# Patient Record
Sex: Male | Born: 1965 | Race: White | Hispanic: No | Marital: Married | Smoking: Never smoker
Health system: Southern US, Community
[De-identification: ages and names within clinical notes are randomized; demographics above are authoritative.]

## PROBLEM LIST (undated history)

## (undated) DIAGNOSIS — K219 Gastro-esophageal reflux disease without esophagitis: Secondary | ICD-10-CM

## (undated) DIAGNOSIS — F419 Anxiety disorder, unspecified: Secondary | ICD-10-CM

## (undated) DIAGNOSIS — K859 Acute pancreatitis without necrosis or infection, unspecified: Secondary | ICD-10-CM

## (undated) HISTORY — PX: BACK SURGERY: SHX140

## (undated) HISTORY — PX: APPENDECTOMY: SHX54

---

## 1998-06-25 ENCOUNTER — Emergency Department (HOSPITAL_COMMUNITY): Admission: EM | Admit: 1998-06-25 | Discharge: 1998-06-25 | Payer: Self-pay | Admitting: Emergency Medicine

## 2000-10-14 ENCOUNTER — Ambulatory Visit (HOSPITAL_COMMUNITY): Admission: RE | Admit: 2000-10-14 | Discharge: 2000-10-14 | Payer: Self-pay | Admitting: Orthopaedic Surgery

## 2000-10-28 ENCOUNTER — Ambulatory Visit (HOSPITAL_COMMUNITY): Admission: RE | Admit: 2000-10-28 | Discharge: 2000-10-28 | Payer: Self-pay | Admitting: Orthopaedic Surgery

## 2000-11-12 ENCOUNTER — Ambulatory Visit (HOSPITAL_COMMUNITY): Admission: RE | Admit: 2000-11-12 | Discharge: 2000-11-12 | Payer: Self-pay | Admitting: Orthopaedic Surgery

## 2001-05-07 ENCOUNTER — Encounter: Payer: Self-pay | Admitting: Neurosurgery

## 2001-05-07 ENCOUNTER — Ambulatory Visit (HOSPITAL_COMMUNITY): Admission: RE | Admit: 2001-05-07 | Discharge: 2001-05-07 | Payer: Self-pay | Admitting: Neurosurgery

## 2001-11-10 ENCOUNTER — Encounter: Admission: RE | Admit: 2001-11-10 | Discharge: 2001-11-10 | Payer: Self-pay | Admitting: Neurosurgery

## 2001-11-10 ENCOUNTER — Encounter: Payer: Self-pay | Admitting: Neurosurgery

## 2001-11-24 ENCOUNTER — Encounter: Payer: Self-pay | Admitting: Neurosurgery

## 2001-11-24 ENCOUNTER — Encounter: Admission: RE | Admit: 2001-11-24 | Discharge: 2001-11-24 | Payer: Self-pay | Admitting: Neurosurgery

## 2001-12-08 ENCOUNTER — Encounter: Payer: Self-pay | Admitting: Neurosurgery

## 2001-12-08 ENCOUNTER — Encounter: Admission: RE | Admit: 2001-12-08 | Discharge: 2001-12-08 | Payer: Self-pay | Admitting: Neurosurgery

## 2004-12-13 ENCOUNTER — Ambulatory Visit (HOSPITAL_COMMUNITY): Admission: RE | Admit: 2004-12-13 | Discharge: 2004-12-13 | Payer: Self-pay | Admitting: Neurosurgery

## 2005-02-10 ENCOUNTER — Ambulatory Visit (HOSPITAL_COMMUNITY): Admission: RE | Admit: 2005-02-10 | Discharge: 2005-02-10 | Payer: Self-pay | Admitting: General Surgery

## 2006-02-04 ENCOUNTER — Encounter: Payer: Self-pay | Admitting: Emergency Medicine

## 2013-10-11 ENCOUNTER — Encounter (HOSPITAL_COMMUNITY): Payer: Self-pay | Admitting: Pharmacy Technician

## 2013-10-19 ENCOUNTER — Encounter (HOSPITAL_COMMUNITY): Payer: Self-pay | Admitting: *Deleted

## 2013-10-31 ENCOUNTER — Other Ambulatory Visit: Payer: Self-pay | Admitting: Gastroenterology

## 2013-10-31 NOTE — Addendum Note (Signed)
Addended by: Esmeralda Malay on: 10/31/2013 01:35 PM   Modules accepted: Orders  

## 2013-11-02 ENCOUNTER — Encounter (HOSPITAL_COMMUNITY): Payer: BC Managed Care – PPO | Admitting: Certified Registered"

## 2013-11-02 ENCOUNTER — Encounter (HOSPITAL_COMMUNITY): Payer: Self-pay | Admitting: *Deleted

## 2013-11-02 ENCOUNTER — Ambulatory Visit (HOSPITAL_COMMUNITY): Payer: BC Managed Care – PPO | Admitting: Certified Registered"

## 2013-11-02 ENCOUNTER — Ambulatory Visit (HOSPITAL_COMMUNITY)
Admission: RE | Admit: 2013-11-02 | Discharge: 2013-11-02 | Disposition: A | Discharge status: Home / Self Care | Payer: BC Managed Care – PPO | Source: Ambulatory Visit | Attending: Gastroenterology | Admitting: Gastroenterology

## 2013-11-02 ENCOUNTER — Encounter (HOSPITAL_COMMUNITY)
Admission: RE | Disposition: A | Discharge status: Home / Self Care | Payer: Self-pay | Source: Ambulatory Visit | Attending: Gastroenterology

## 2013-11-02 DIAGNOSIS — K209 Esophagitis, unspecified without bleeding: Secondary | ICD-10-CM | POA: Insufficient documentation

## 2013-11-02 DIAGNOSIS — K449 Diaphragmatic hernia without obstruction or gangrene: Secondary | ICD-10-CM | POA: Insufficient documentation

## 2013-11-02 DIAGNOSIS — K222 Esophageal obstruction: Secondary | ICD-10-CM | POA: Insufficient documentation

## 2013-11-02 DIAGNOSIS — K219 Gastro-esophageal reflux disease without esophagitis: Secondary | ICD-10-CM | POA: Insufficient documentation

## 2013-11-02 DIAGNOSIS — R109 Unspecified abdominal pain: Secondary | ICD-10-CM | POA: Insufficient documentation

## 2013-11-02 DIAGNOSIS — K859 Acute pancreatitis without necrosis or infection, unspecified: Secondary | ICD-10-CM | POA: Insufficient documentation

## 2013-11-02 HISTORY — PX: EUS: SHX5427

## 2013-11-02 HISTORY — DX: Acute pancreatitis without necrosis or infection, unspecified: K85.90

## 2013-11-02 HISTORY — DX: Gastro-esophageal reflux disease without esophagitis: K21.9

## 2013-11-02 HISTORY — DX: Anxiety disorder, unspecified: F41.9

## 2013-11-02 HISTORY — PX: ESOPHAGOGASTRODUODENOSCOPY (EGD) WITH PROPOFOL: SHX5813

## 2013-11-02 SURGERY — ESOPHAGOGASTRODUODENOSCOPY (EGD) WITH PROPOFOL
Anesthesia: Monitor Anesthesia Care

## 2013-11-02 MED ORDER — ONDANSETRON HCL 4 MG/2ML IJ SOLN
INTRAMUSCULAR | Status: DC | PRN
  Administered 2013-11-02: 4 mg via INTRAVENOUS

## 2013-11-02 MED ORDER — LACTATED RINGERS IV SOLN
INTRAVENOUS | Status: DC
  Administered 2013-11-02: 1000 mL via INTRAVENOUS

## 2013-11-02 MED ORDER — PROPOFOL 10 MG/ML IV BOLUS
INTRAVENOUS | Status: DC | PRN
  Administered 2013-11-02: 50 mg via INTRAVENOUS

## 2013-11-02 MED ORDER — PROPOFOL 10 MG/ML IV BOLUS
INTRAVENOUS | Status: AC
  Filled 2013-11-02: qty 20

## 2013-11-02 MED ORDER — PROPOFOL INFUSION 10 MG/ML OPTIME
INTRAVENOUS | Status: DC | PRN
  Administered 2013-11-02: 200 ug/kg/min via INTRAVENOUS

## 2013-11-02 MED ORDER — BUTAMBEN-TETRACAINE-BENZOCAINE 2-2-14 % EX AERO
INHALATION_SPRAY | CUTANEOUS | Status: DC | PRN
  Administered 2013-11-02: 2 via TOPICAL

## 2013-11-02 MED ORDER — ONDANSETRON HCL 4 MG/2ML IJ SOLN
INTRAMUSCULAR | Status: AC
  Filled 2013-11-02: qty 2

## 2013-11-02 MED ORDER — SODIUM CHLORIDE 0.9 % IV SOLN: INTRAVENOUS | Status: DC

## 2013-11-02 SURGICAL SUPPLY — 15 items

## 2013-11-02 NOTE — Discharge Instructions (Signed)
Endoscopy °Care After °Please read the instructions outlined below and refer to this sheet in the next few weeks. These discharge instructions provide you with general information on caring for yourself after you leave the hospital. Your doctor may also give you specific instructions. While your treatment has been planned according to the most current medical practices available, unavoidable complications occasionally occur. If you have any problems or questions after discharge, please call Dr. Ares Cardozo ([facility]) at [phone]. ° °HOME CARE INSTRUCTIONS °Activity °· You may resume your regular activity but move at a slower pace for the next 24 hours.  °· Take frequent rest periods for the next 24 hours.  °· Walking will help expel (get rid of) the air and reduce the bloated feeling in your abdomen.  °· No driving for 24 hours (because of the anesthesia (medicine) used during the test).  °· You may shower.  °· Do not sign any important legal documents or operate any machinery for 24 hours (because of the anesthesia used during the test).  °Nutrition °· Drink plenty of fluids.  °· You may resume your normal diet.  °· Begin with a light meal and progress to your normal diet.  °· Avoid alcoholic beverages for 24 hours or as instructed by your caregiver.  °Medications °You may resume your normal medications unless your caregiver tells you otherwise. °What you can expect today °· You may experience abdominal discomfort such as a feeling of fullness or "gas" pains.  °· You may experience a sore throat for 2 to 3 days. This is normal. Gargling with salt water may help this.  °·  °SEEK IMMEDIATE MEDICAL CARE IF: °· You have excessive nausea (feeling sick to your stomach) and/or vomiting.  °· You have severe abdominal pain and distention (swelling).  °· You have trouble swallowing.  °· You have a temperature over 100° F (37.8° C).  °· You have rectal bleeding or vomiting of blood.  °Document Released:  05/06/2004 Document Revised: 06/04/2011 Document Reviewed: 11/17/2007 °ExitCare® Patient Information ©2012 [employer]. °

## 2013-11-02 NOTE — Progress Notes (Signed)
Prior to procedure, patient requested that a picture be made of himself  using his Iphone while he was being scoped.  Picture was taken.

## 2013-11-02 NOTE — Preoperative (Signed)
Beta Blockers   Reason not to administer Beta Blockers:Not Applicable 

## 2013-11-02 NOTE — Transfer of Care (Signed)
Immediate Anesthesia Transfer of Care Note  Patient: Tyler Cantrell  Procedure(s) Performed: Procedure(s) (LRB): ESOPHAGOGASTRODUODENOSCOPY (EGD) WITH PROPOFOL (N/A) ESOPHAGEAL ENDOSCOPIC ULTRASOUND (EUS) RADIAL (N/A)  Patient Location: PACU  Anesthesia Type: MAC  Level of Consciousness: sedated, patient cooperative and responds to stimulation  Airway & Oxygen Therapy: Patient Spontanous Breathing and Patient connected to face mask oxgen  Post-op Assessment: Report given to PACU RN and Post -op Vital signs reviewed and stable  Post vital signs: Reviewed and stable  Complications: No apparent anesthesia complications

## 2013-11-02 NOTE — Anesthesia Postprocedure Evaluation (Signed)
Anesthesia Post Note  Patient: Tyler BlazerJason B Cantrell  Procedure(s) Performed: Procedure(s) (LRB): ESOPHAGOGASTRODUODENOSCOPY (EGD) WITH PROPOFOL (N/A) ESOPHAGEAL ENDOSCOPIC ULTRASOUND (EUS) RADIAL (N/A)  Anesthesia type: MAC  Patient location: PACU  Post pain: Pain level controlled  Post assessment: Post-op Vital signs reviewed  Last Vitals: BP 108/67  Temp(Src) 36.6 C (Oral)  Resp 20  Ht 6\' 1"  (1.854 m)  Wt 200 lb (90.719 kg)  BMI 26.39 kg/m2  SpO2 94%  Post vital signs: Reviewed  Level of consciousness: awake  Complications: No apparent anesthesia complications

## 2013-11-02 NOTE — Op Note (Signed)
[facility] [address] [place], [id number]   ENDOSCOPIC ULTRASOUND PROCEDURE REPORT  PATIENT: Tyler Cantrell, Tyler B.  MR#: [id number] BIRTHDATE: 1966-07-01  GENDER: Male ENDOSCOPIST: Willis ModenaWilliam Bernetta Sutley, MD REFERRED BY:  Guerry Bruinichard Tisovec, M.D. PROCEDURE DATE:  11/02/2013 PROCEDURE:   Esophagogastroduodenoscopy with biopsies; upper endoscopic ultrasound ASA CLASS:      Class II INDICATIONS:   1.  abdominal pain, history of pancreatitis. MEDICATIONS: MAC sedation, administered by CRNA  DESCRIPTION OF PROCEDURE:   After the risks benefits and alternatives of the procedure were  explained, informed consent was obtained. The patient was then placed in the left, lateral, decubitus postion and IV sedation was administered. Throughout the procedure, the patients blood pressure, pulse and oxygen saturations were monitored continuously.  Under direct visualization, the forward-viewing  endoscope followed by the radial echoendoscope were sequentially introduced through the mouth and advanced to the second portion of the duodenum .  Water was used as necessary to provide an acoustic interface.  Upon completion of the imaging, water was removed and the patient was sent to the recovery room in satisfactory condition.    FINDINGS:      EGD:  Medium-sized hiatal hernia.  Widely patent Schatzki's ring.  Mild distal esophagitis. Small segment of salmon-colored mucosa extending 1cm proximal to GE junction, biopsies taken with cold forceps to evaluate for Barrett's esophagus.  Otherwise normal endoscopy to the second portion of the duodenum. EUS:  Normal-appearing pancreas without evidence of chronic pancreatitis.  No pancreatic mass seen.  Gallbladder normal without stones or sludge.  Non-dilated bile duct without wall thickening or choledocholithiasis.  IMPRESSION:     As above.  Some of patient's post-prandial pain symptoms might be GERD-related.  Pancreatitis most  likely alcohol-related, gallbladder less likely.  RECOMMENDATIONS:     1.  Watch for potential complications of procedure. 2.  Await biopsy results. 3.  Prilosec OTC 20 mg once-a-day for 6 weeks. 4.  Conservative antireflux measures. 5.  Follow-up in office in 6 weeks.   _______________________________ Rosalie DoctoreSignedWillis Modena:  Srihari Shellhammer, MD 11/02/2013 10:07 AM   CC:

## 2013-11-02 NOTE — H&P (Signed)
Patient interval history reviewed.  Patient examined again.  There has been no change from documented H/P dated 10/04/2013 (scanned into chart from our office) except as documented above.  Assessment:  1.  Abdominal pain. 2.  Pancreatitis.  Plan:  1.  Endoscopy and endoscopic ultrasound. 2.  Risks (bleeding, infection, bowel perforation that could require surgery, sedation-related changes in cardiopulmonary systems), benefits (identification and possible treatment of source of symptoms, exclusion of certain causes of symptoms), and alternatives (watchful waiting, radiographic imaging studies, empiric medical treatment) of upper endoscopy and endoscopic ultrasound (EGD + EUS) were explained to patient/family in detail and patient wishes to proceed.

## 2013-11-02 NOTE — Anesthesia Preprocedure Evaluation (Signed)
Anesthesia Evaluation  Patient identified by MRN, date of birth, ID band Patient awake    Reviewed: Allergy & Precautions, H&P , NPO status , Patient's Chart, lab work & pertinent test results  Airway Mallampati: II TM Distance: >3 FB Neck ROM: Full    Dental  (+) Dental Advisory Given   Pulmonary neg pulmonary ROS,  breath sounds clear to auscultation        Cardiovascular negative cardio ROS  Rhythm:Regular Rate:Normal     Neuro/Psych PSYCHIATRIC DISORDERS Anxiety negative neurological ROS     GI/Hepatic Neg liver ROS, GERD-  ,  Endo/Other  negative endocrine ROS  Renal/GU negative Renal ROS     Musculoskeletal negative musculoskeletal ROS (+)   Abdominal   Peds negative pediatric ROS (+)  Hematology negative hematology ROS (+)   Anesthesia Other Findings   Reproductive/Obstetrics                           Anesthesia Physical Anesthesia Plan  ASA: II  Anesthesia Plan: MAC   Post-op Pain Management:    Induction: Intravenous  Airway Management Planned:   Additional Equipment:   Intra-op Plan:   Post-operative Plan:   Informed Consent: I have reviewed the patients History and Physical, chart, labs and discussed the procedure including the risks, benefits and alternatives for the proposed anesthesia with the patient or authorized representative who has indicated his/her understanding and acceptance.   Dental advisory given  Plan Discussed with: CRNA  Anesthesia Plan Comments:         Anesthesia Quick Evaluation

## 2013-11-03 ENCOUNTER — Encounter (HOSPITAL_COMMUNITY): Payer: Self-pay | Admitting: Gastroenterology

## 2014-04-11 ENCOUNTER — Other Ambulatory Visit (HOSPITAL_COMMUNITY): Payer: Self-pay | Admitting: Gastroenterology

## 2014-04-11 DIAGNOSIS — R1011 Right upper quadrant pain: Secondary | ICD-10-CM

## 2014-04-18 ENCOUNTER — Encounter (HOSPITAL_COMMUNITY)
Admission: RE | Admit: 2014-04-18 | Discharge: 2014-04-18 | Disposition: A | Discharge status: Home / Self Care | Payer: BC Managed Care – PPO | Source: Ambulatory Visit | Attending: Gastroenterology | Admitting: Gastroenterology

## 2014-04-18 DIAGNOSIS — R1011 Right upper quadrant pain: Secondary | ICD-10-CM | POA: Insufficient documentation

## 2014-04-18 MED ORDER — SINCALIDE 5 MCG IJ SOLR
0.0200 ug/kg | Freq: Once | INTRAMUSCULAR | Status: AC
  Administered 2014-04-18: 11:00:00 via INTRAVENOUS

## 2014-04-18 MED ORDER — SINCALIDE 5 MCG IJ SOLR
INTRAMUSCULAR | Status: AC
  Filled 2014-04-18: qty 5

## 2014-04-18 MED ORDER — STERILE WATER FOR INJECTION IJ SOLN
5.0000 mL | Freq: Once | INTRAMUSCULAR | Status: AC
  Administered 2014-04-18: 5 mL via INTRAMUSCULAR
  Filled 2014-04-18: qty 5

## 2014-04-18 MED ORDER — STERILE WATER FOR INJECTION IJ SOLN
INTRAMUSCULAR | Status: AC
  Administered 2014-04-18: 5 mL via INTRAMUSCULAR
  Filled 2014-04-18: qty 10

## 2014-04-18 MED ORDER — TECHNETIUM TC 99M MEBROFENIN IV KIT
5.0000 | PACK | Freq: Once | INTRAVENOUS | Status: AC | PRN
  Administered 2014-04-18: 5 via INTRAVENOUS

## 2014-04-25 ENCOUNTER — Ambulatory Visit (HOSPITAL_COMMUNITY): Payer: BC Managed Care – PPO

## 2014-05-01 ENCOUNTER — Encounter (INDEPENDENT_AMBULATORY_CARE_PROVIDER_SITE_OTHER): Payer: Self-pay | Admitting: Surgery

## 2014-05-01 ENCOUNTER — Other Ambulatory Visit (INDEPENDENT_AMBULATORY_CARE_PROVIDER_SITE_OTHER): Payer: Self-pay | Admitting: Surgery

## 2014-05-11 ENCOUNTER — Ambulatory Visit (INDEPENDENT_AMBULATORY_CARE_PROVIDER_SITE_OTHER): Payer: BC Managed Care – PPO | Admitting: Surgery

## 2014-05-11 ENCOUNTER — Encounter (INDEPENDENT_AMBULATORY_CARE_PROVIDER_SITE_OTHER): Payer: Self-pay | Admitting: Surgery

## 2014-05-11 VITALS — BP 118/80 | HR 57 | Temp 97.6°F | Ht 73.0 in | Wt 201.0 lb

## 2014-05-11 DIAGNOSIS — R1011 Right upper quadrant pain: Secondary | ICD-10-CM | POA: Insufficient documentation

## 2014-05-11 DIAGNOSIS — Z8719 Personal history of other diseases of the digestive system: Secondary | ICD-10-CM

## 2014-05-11 NOTE — Progress Notes (Signed)
Patient ID: Tyler Cantrell, male   DOB: 1965/12/29, 48 y.o.   MRN: [id number]  Chief Complaint  Patient presents with  . Abdominal Pain    HPI Tyler Cantrell is a 48 y.o. male.   HPI This is a pleasant 48 year old gentleman referred to me by Dr. Dulce Sellar for abdominal pain. He has been having epigastric and right upper quadrant abdominal pain intermittently for almost a year. He has had bloating with nausea. He even had a bout of mild pancreatitis. This occurs after a missed anything he eats. Some attacks will last as long as 4 days. He has had light colored bowel movements. His workup has included an upper endoscopy and endoscopic ultrasound which was negative for gallstones. He denies jaundice. The pain is described as both sharp and cramping. Past Medical History  Diagnosis Date  . Anxiety   . GERD (gastroesophageal reflux disease)   . Pancreatitis off and off last 3-4 months    Past Surgical History  Procedure Laterality Date  . Appendectomy    . Back surgery    . Esophagogastroduodenoscopy (egd) with propofol N/A 11/02/2013    Procedure: ESOPHAGOGASTRODUODENOSCOPY (EGD) WITH PROPOFOL;  Surgeon: Willis Modena, MD;  Location: [facility];  Service: Endoscopy;  Laterality: N/A;  . Eus N/A 11/02/2013    Procedure: ESOPHAGEAL ENDOSCOPIC ULTRASOUND (EUS) RADIAL;  Surgeon: Willis Modena, MD;  Location: [facility];  Service: Endoscopy;  Laterality: N/A;    History reviewed. No pertinent family history.  Social History History  Substance Use Topics  . Smoking status: Never Smoker   . Smokeless tobacco: Never Used  . Alcohol Use: No    Allergies  Allergen Reactions  . Penicillins Hives    Current Outpatient Prescriptions  Medication Sig Dispense Refill  . beta carotene w/minerals (OCUVITE) tablet Take 1 tablet by mouth daily.      Marland Kitchen esomeprazole (NEXIUM) 20 MG capsule Take 20 mg by mouth daily at 12 noon.      Marland Kitchen glucosamine-chondroitin 500-400 MG tablet Take 1 tablet by  mouth 3 (three) times daily.      Marland Kitchen HYDROcodone-acetaminophen (NORCO/VICODIN) 5-325 MG per tablet Take 1 tablet by mouth every 6 (six) hours as needed for moderate pain.      . Omega-3 Fatty Acids (FISH OIL) 1000 MG CAPS Take by mouth.      . Pancrelipase, Lip-Prot-Amyl, 24000 UNITS CPEP Take 24,000 Units by mouth daily.      . sertraline (ZOLOFT) 50 MG tablet Take 50 mg by mouth daily with breakfast.      . Testosterone (ANDROGEL) 20.25 MG/1.25GM (1.62%) GEL Place 1 application onto the skin daily.       No current facility-administered medications for this visit.    Review of Systems Review of Systems  Constitutional: Negative for fever, chills and unexpected weight change.  HENT: Negative for congestion, hearing loss, sore throat, trouble swallowing and voice change.   Eyes: Negative for visual disturbance.  Respiratory: Negative for cough and wheezing.   Cardiovascular: Negative for chest pain, palpitations and leg swelling.  Gastrointestinal: Positive for nausea, abdominal pain and abdominal distention. Negative for vomiting, diarrhea, constipation, blood in stool, anal bleeding and rectal pain.  Genitourinary: Negative for hematuria and difficulty urinating.  Musculoskeletal: Negative for arthralgias.  Skin: Negative for rash and wound.  Neurological: Negative for seizures, syncope, weakness and headaches.  Hematological: Negative for adenopathy. Does not bruise/bleed easily.  Psychiatric/Behavioral: Negative for confusion.    Blood pressure 118/80, pulse 57, temperature 97.6  F (36.4 C), height 6\' 1"  (1.854 m), weight 201 lb (91.173 kg).  Physical Exam Physical Exam  Constitutional: He is oriented to person, place, and time. He appears well-developed and well-nourished. No distress.  HENT:  Head: Normocephalic and atraumatic.  Right Ear: External ear normal.  Left Ear: External ear normal.  Nose: Nose normal.  Mouth/Throat: Oropharynx is clear and moist. No oropharyngeal  exudate.  Eyes: Conjunctivae are normal. Pupils are equal, round, and reactive to light. Right eye exhibits no discharge. Left eye exhibits no discharge. No scleral icterus.  Neck: Normal range of motion. Neck supple. No tracheal deviation present.  Cardiovascular: Normal rate, regular rhythm, normal heart sounds and intact distal pulses.   No murmur heard. Pulmonary/Chest: Effort normal and breath sounds normal. No respiratory distress. He has no wheezes.  Abdominal: Soft. Bowel sounds are normal. There is tenderness. There is guarding.  There is tenderness with guarding in the right upper quadrant  Musculoskeletal: Normal range of motion. He exhibits no edema and no tenderness.  Lymphadenopathy:    He has no cervical adenopathy.  Neurological: He is alert and oriented to person, place, and time.  Skin: Skin is warm and dry. No rash noted. No erythema.  Psychiatric: His behavior is normal. Judgment normal.    Data Reviewed I have reviewed his laboratory data and notes  Assessment    Right upper quadrant abdominal pain of uncertain etiology     Plan    Given his symptoms and his family history of malignancy, I recommend a CAT scan of the abdomen and pelvis to evaluate both his liver, gallbladder and pancreas. I do suspect this may represent chronic cholecystitis. Prior to a laparoscopic cholecystectomy, I believe the scan is necessary to rule out another cause of his symptoms preoperatively. I will call him back with the results of the CAT scan        Christmas Faraci A 05/11/2014, 3:44 PM

## 2014-05-15 ENCOUNTER — Other Ambulatory Visit: Payer: BC Managed Care – PPO

## 2014-05-16 ENCOUNTER — Other Ambulatory Visit: Payer: BC Managed Care – PPO

## 2014-05-18 ENCOUNTER — Ambulatory Visit
Admission: RE | Admit: 2014-05-18 | Discharge: 2014-05-18 | Disposition: A | Discharge status: Home / Self Care | Payer: BC Managed Care – PPO | Source: Ambulatory Visit | Attending: Surgery | Admitting: Surgery

## 2014-05-18 DIAGNOSIS — R1011 Right upper quadrant pain: Secondary | ICD-10-CM

## 2014-05-18 DIAGNOSIS — Z8719 Personal history of other diseases of the digestive system: Secondary | ICD-10-CM

## 2014-05-18 MED ORDER — IOHEXOL 300 MG/ML  SOLN
125.0000 mL | Freq: Once | INTRAMUSCULAR | Status: AC | PRN
  Administered 2014-05-18: 125 mL via INTRAVENOUS

## 2014-05-21 ENCOUNTER — Other Ambulatory Visit (INDEPENDENT_AMBULATORY_CARE_PROVIDER_SITE_OTHER): Payer: Self-pay | Admitting: Surgery

## 2014-05-22 ENCOUNTER — Encounter (INDEPENDENT_AMBULATORY_CARE_PROVIDER_SITE_OTHER): Payer: Self-pay

## 2014-08-03 ENCOUNTER — Other Ambulatory Visit (INDEPENDENT_AMBULATORY_CARE_PROVIDER_SITE_OTHER): Payer: Self-pay | Admitting: Surgery

## 2014-09-27 ENCOUNTER — Other Ambulatory Visit (INDEPENDENT_AMBULATORY_CARE_PROVIDER_SITE_OTHER): Payer: Self-pay | Admitting: Surgery

## 2015-01-17 ENCOUNTER — Other Ambulatory Visit (HOSPITAL_COMMUNITY): Payer: Self-pay | Admitting: Gastroenterology

## 2015-01-17 DIAGNOSIS — R140 Abdominal distension (gaseous): Secondary | ICD-10-CM

## 2015-01-17 DIAGNOSIS — R1013 Epigastric pain: Secondary | ICD-10-CM

## 2015-01-19 IMAGING — CT CT ABD-PELV W/ CM
2 of 5 series · 17 of 46 positions shown, 19 images · IV contrast (READICAT/WATER & [ID] OMNI 300)
Comparison: None.

CLINICAL DATA: Right upper quadrant abdominal pain, bloating,
nausea and history of pancreatitis. Prior appendectomy.

EXAM:
CT ABDOMEN AND PELVIS WITH CONTRAST
TECHNIQUE: Multidetector CT imaging of the abdomen and pelvis was performed
using the standard protocol following bolus administration of
intravenous contrast.
CONTRAST:  125mL OMNIPAQUE IOHEXOL 300 MG/ML  SOLN

[Series 2: abd/pelvis with · axial · 0.76mm/px · z∈[-426,-26]mm · 14 of 91 slices shown, 16 images]
[im 6/91  soft-tissue]
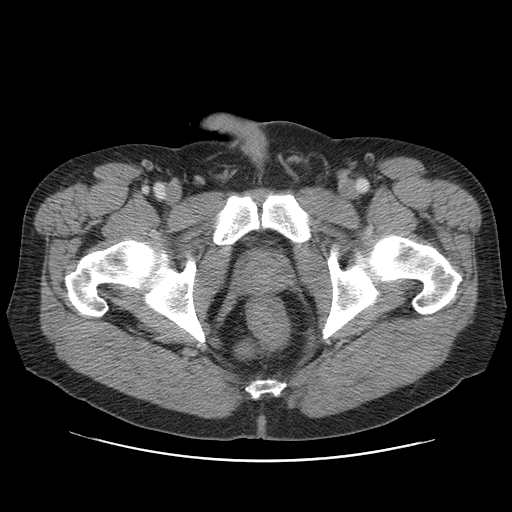
[im 6/91  bone]
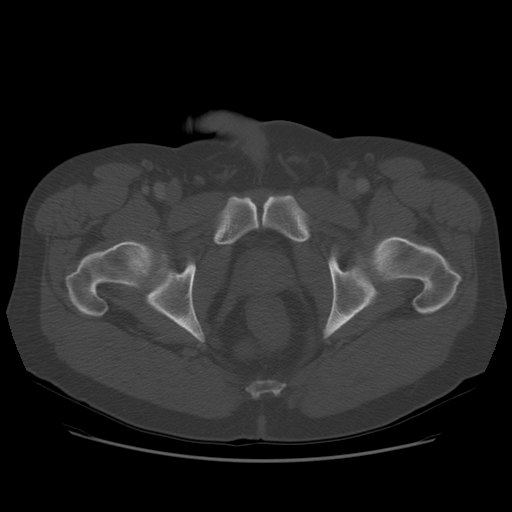
[im 11/91  soft-tissue]
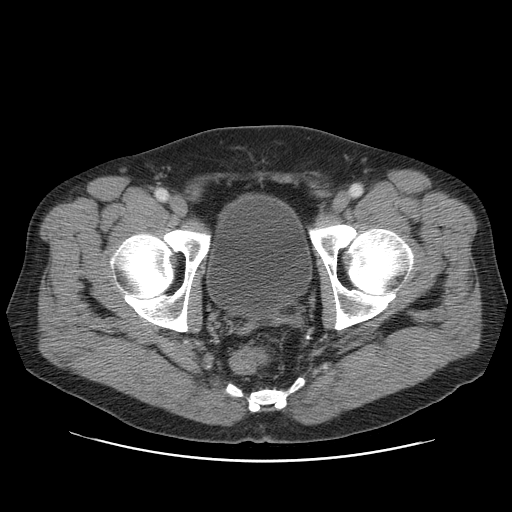
[im 21/91  soft-tissue]
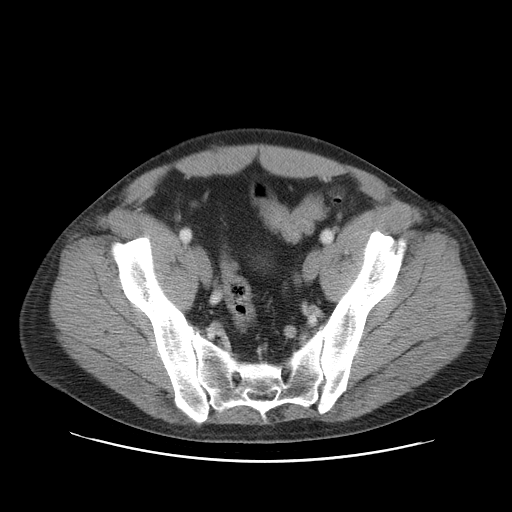
[im 26/91  soft-tissue]
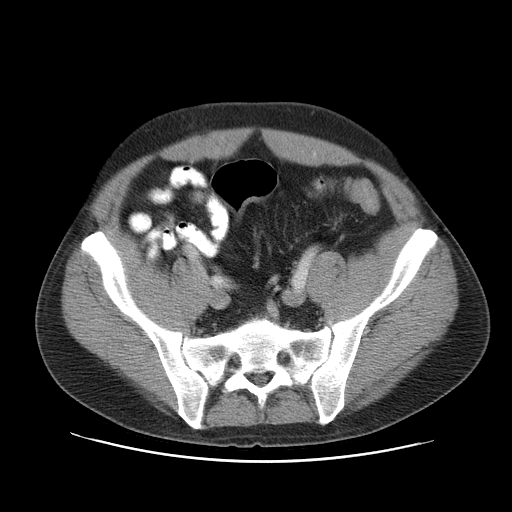
[im 31/91  soft-tissue]
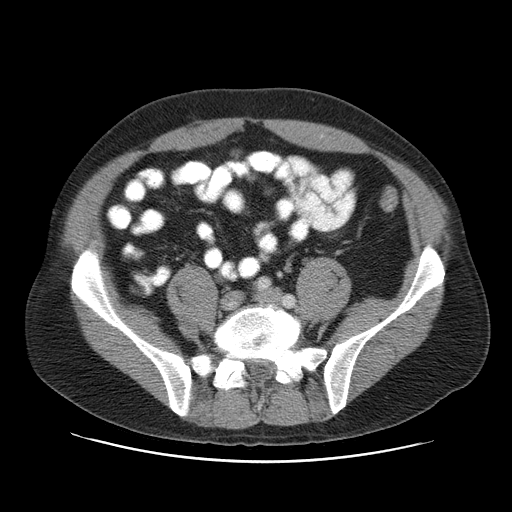
[im 36/91  soft-tissue]
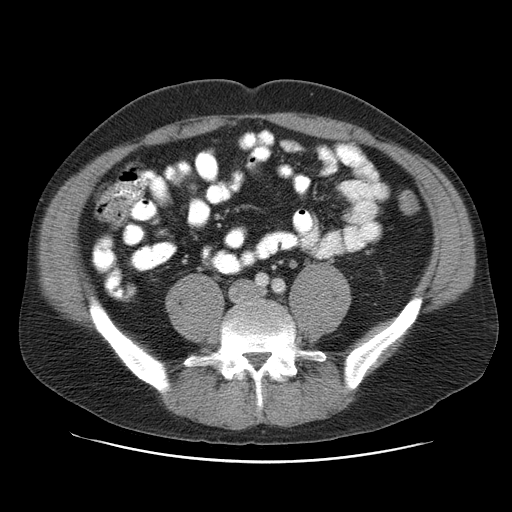
[im 41/91  soft-tissue]
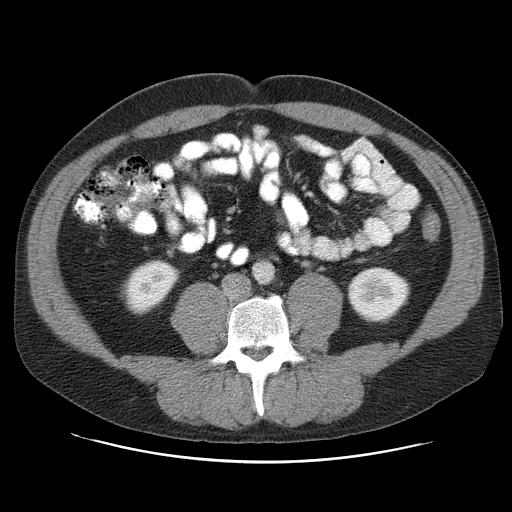
[im 51/91  soft-tissue]
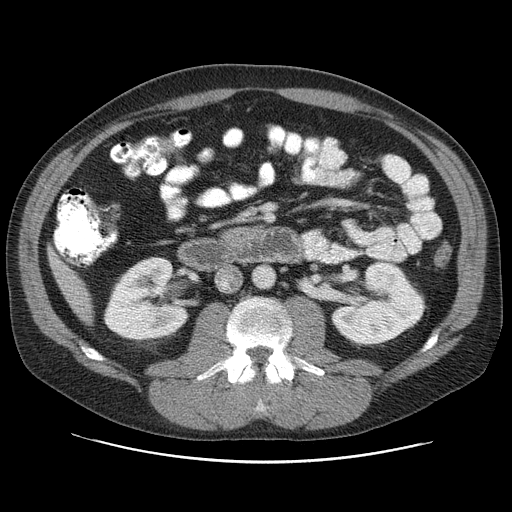
[im 56/91  soft-tissue]
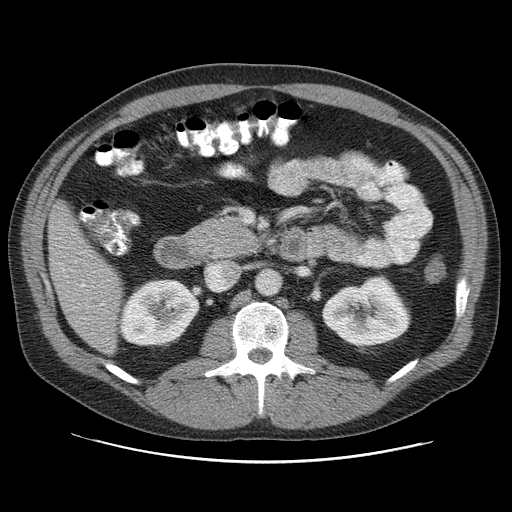
[im 56/91  bone]
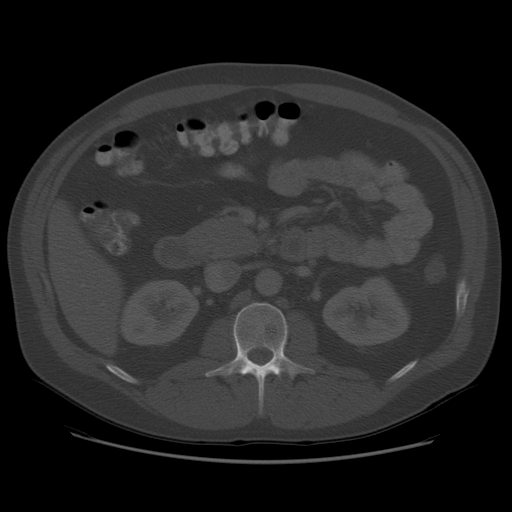
[im 61/91  soft-tissue]
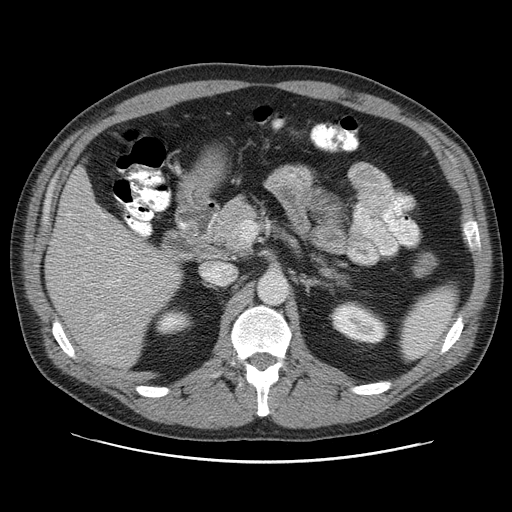
[im 66/91  soft-tissue]
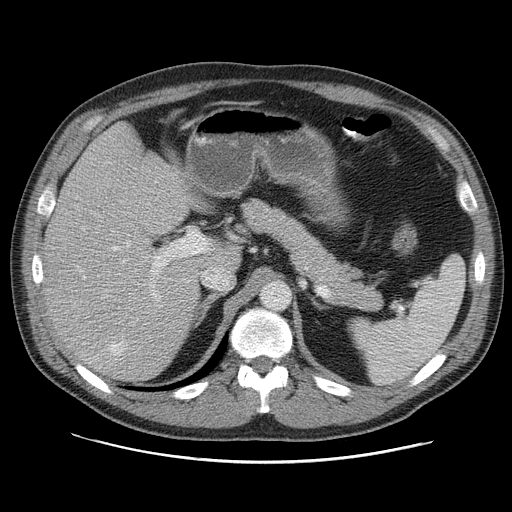
[im 71/91  soft-tissue]
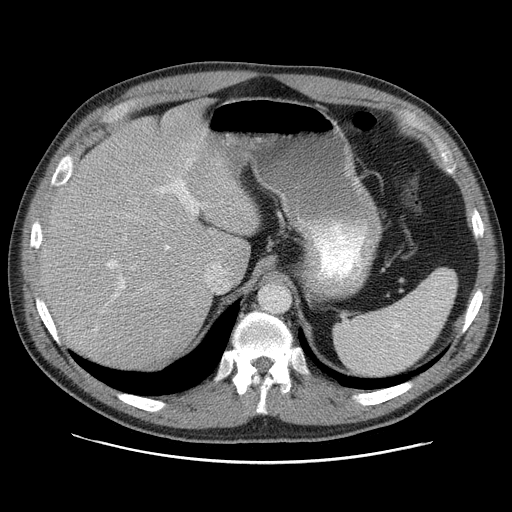
[im 81/91  soft-tissue]
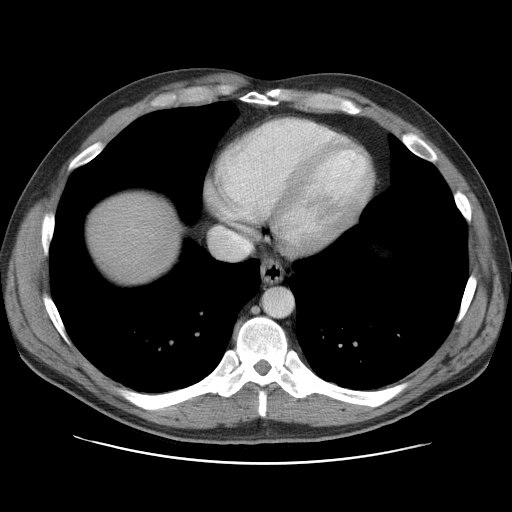
[im 86/91  soft-tissue]
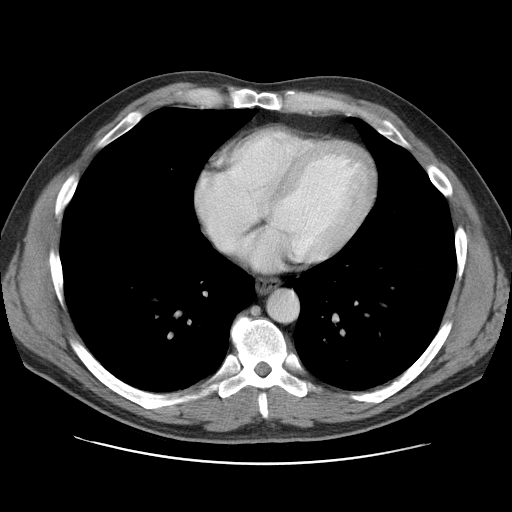

[Series 400: cor · coronal · 0.99mm/px · 3 of 150 slices shown]
[im 50/150  soft-tissue]
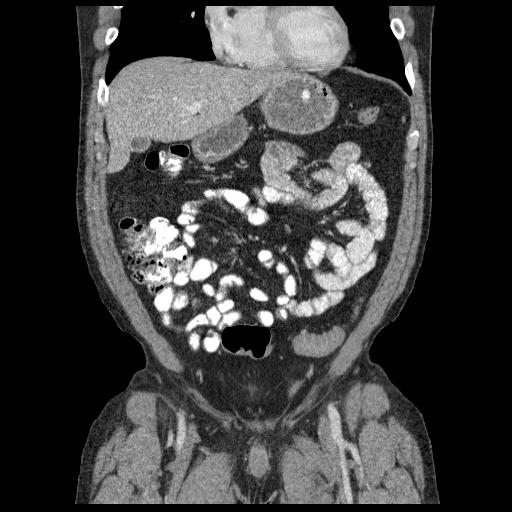
[im 67/150  soft-tissue]
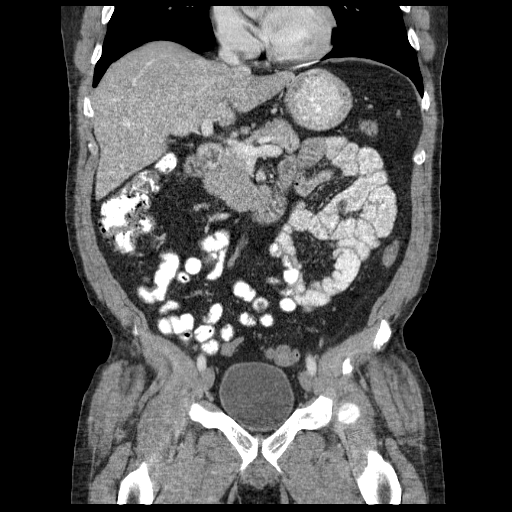
[im 83/150  soft-tissue]
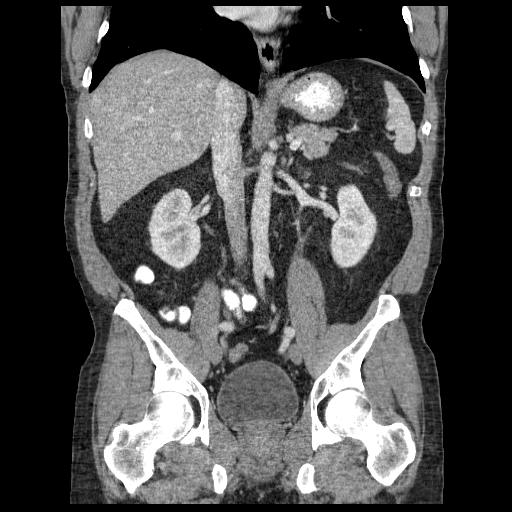

[17 of 46 positions shown; findings below may reference images not displayed]

FINDINGS: The liver shows mild and diffuse steatosis without evidence of
cirrhosis or mass. The gallbladder is contracted and shows no
visible calculi or surrounding inflammation. No biliary ductal
dilatation is identified.

The pancreas shows normal enhancement and size by CT without
evidence of pancreatitis, mass, pseudocyst or necrosis. No
pancreatic calcifications or ductal dilatation.

The spleen, adrenal glands and kidneys are within normal limits.
Bowel shows no evidence of obstruction, ileus or thickening. No
abnormal fluid collections or abscess identified. No masses or
enlarged lymph nodes are seen. No hernias are identified. The
bladder is unremarkable. No abnormal calcifications. Bony structures
show degenerative disc disease at the L5-S1 level.
IMPRESSION: No acute findings in the abdomen or pelvis. Mild hepatic steatosis.
No evidence of acute pancreatitis or complications related to
pancreatitis by CT.

## 2015-02-01 ENCOUNTER — Ambulatory Visit (HOSPITAL_COMMUNITY): Payer: 59

## 2015-02-16 ENCOUNTER — Encounter (INDEPENDENT_AMBULATORY_CARE_PROVIDER_SITE_OTHER): Payer: Self-pay

## 2015-02-21 ENCOUNTER — Encounter (HOSPITAL_COMMUNITY): Payer: 59 | Attending: Gastroenterology

## 2015-08-09 ENCOUNTER — Other Ambulatory Visit (HOSPITAL_COMMUNITY): Payer: Self-pay | Admitting: Internal Medicine

## 2015-08-09 DIAGNOSIS — R1013 Epigastric pain: Secondary | ICD-10-CM

## 2015-08-14 ENCOUNTER — Ambulatory Visit (HOSPITAL_COMMUNITY): Payer: 59

## 2015-10-18 ENCOUNTER — Ambulatory Visit (HOSPITAL_COMMUNITY): Payer: 59

## 2016-01-31 DIAGNOSIS — Z0000 Encounter for general adult medical examination without abnormal findings: Secondary | ICD-10-CM | POA: Diagnosis not present

## 2016-01-31 DIAGNOSIS — Z125 Encounter for screening for malignant neoplasm of prostate: Secondary | ICD-10-CM | POA: Diagnosis not present

## 2016-02-06 DIAGNOSIS — M1990 Unspecified osteoarthritis, unspecified site: Secondary | ICD-10-CM | POA: Diagnosis not present

## 2016-02-06 DIAGNOSIS — K909 Intestinal malabsorption, unspecified: Secondary | ICD-10-CM | POA: Diagnosis not present

## 2016-02-06 DIAGNOSIS — E291 Testicular hypofunction: Secondary | ICD-10-CM | POA: Diagnosis not present

## 2016-02-06 DIAGNOSIS — F321 Major depressive disorder, single episode, moderate: Secondary | ICD-10-CM | POA: Diagnosis not present

## 2016-02-06 DIAGNOSIS — Z0000 Encounter for general adult medical examination without abnormal findings: Secondary | ICD-10-CM | POA: Diagnosis not present

## 2016-03-05 ENCOUNTER — Encounter (HOSPITAL_COMMUNITY)
Admission: RE | Admit: 2016-03-05 | Discharge: 2016-03-05 | Disposition: A | Discharge status: Home / Self Care | Payer: BLUE CROSS/BLUE SHIELD | Source: Ambulatory Visit | Attending: Gastroenterology | Admitting: Gastroenterology

## 2016-03-05 DIAGNOSIS — R109 Unspecified abdominal pain: Secondary | ICD-10-CM | POA: Diagnosis not present

## 2016-03-05 DIAGNOSIS — R140 Abdominal distension (gaseous): Secondary | ICD-10-CM | POA: Insufficient documentation

## 2016-03-05 DIAGNOSIS — R1013 Epigastric pain: Secondary | ICD-10-CM | POA: Insufficient documentation

## 2016-03-05 MED ORDER — TECHNETIUM TC 99M SULFUR COLLOID
2.1000 | Freq: Once | INTRAVENOUS | Status: AC | PRN
  Administered 2016-03-05: 2.1 via ORAL

## 2016-05-30 DIAGNOSIS — Z6827 Body mass index (BMI) 27.0-27.9, adult: Secondary | ICD-10-CM | POA: Diagnosis not present

## 2016-05-30 DIAGNOSIS — M47817 Spondylosis without myelopathy or radiculopathy, lumbosacral region: Secondary | ICD-10-CM | POA: Diagnosis not present

## 2016-06-03 ENCOUNTER — Other Ambulatory Visit: Payer: Self-pay | Admitting: Neurosurgery

## 2016-06-03 DIAGNOSIS — M47817 Spondylosis without myelopathy or radiculopathy, lumbosacral region: Secondary | ICD-10-CM

## 2016-07-15 ENCOUNTER — Ambulatory Visit
Admission: RE | Admit: 2016-07-15 | Discharge: 2016-07-15 | Disposition: A | Discharge status: Home / Self Care | Payer: BLUE CROSS/BLUE SHIELD | Source: Ambulatory Visit | Attending: Neurosurgery | Admitting: Neurosurgery

## 2016-07-15 DIAGNOSIS — M47817 Spondylosis without myelopathy or radiculopathy, lumbosacral region: Secondary | ICD-10-CM

## 2016-07-15 DIAGNOSIS — M5126 Other intervertebral disc displacement, lumbar region: Secondary | ICD-10-CM | POA: Diagnosis not present

## 2016-07-15 MED ORDER — GADOBENATE DIMEGLUMINE 529 MG/ML IV SOLN
19.0000 mL | Freq: Once | INTRAVENOUS | Status: AC | PRN
  Administered 2016-07-15: 19 mL via INTRAVENOUS

## 2016-09-01 ENCOUNTER — Ambulatory Visit
Admission: RE | Admit: 2016-09-01 | Discharge: 2016-09-01 | Disposition: A | Discharge status: Home / Self Care | Payer: BLUE CROSS/BLUE SHIELD | Source: Ambulatory Visit | Attending: Gastroenterology | Admitting: Gastroenterology

## 2016-09-01 ENCOUNTER — Other Ambulatory Visit: Payer: Self-pay | Admitting: Gastroenterology

## 2016-09-01 DIAGNOSIS — R140 Abdominal distension (gaseous): Secondary | ICD-10-CM

## 2016-09-01 DIAGNOSIS — K219 Gastro-esophageal reflux disease without esophagitis: Secondary | ICD-10-CM | POA: Diagnosis not present

## 2016-09-01 DIAGNOSIS — R109 Unspecified abdominal pain: Secondary | ICD-10-CM | POA: Diagnosis not present

## 2017-02-09 DIAGNOSIS — E7800 Pure hypercholesterolemia, unspecified: Secondary | ICD-10-CM | POA: Diagnosis not present

## 2017-02-10 DIAGNOSIS — F321 Major depressive disorder, single episode, moderate: Secondary | ICD-10-CM | POA: Diagnosis not present

## 2017-02-10 DIAGNOSIS — K909 Intestinal malabsorption, unspecified: Secondary | ICD-10-CM | POA: Diagnosis not present

## 2017-02-10 DIAGNOSIS — Z1389 Encounter for screening for other disorder: Secondary | ICD-10-CM | POA: Diagnosis not present

## 2017-02-10 DIAGNOSIS — E298 Other testicular dysfunction: Secondary | ICD-10-CM | POA: Diagnosis not present

## 2017-02-10 DIAGNOSIS — Z0000 Encounter for general adult medical examination without abnormal findings: Secondary | ICD-10-CM | POA: Diagnosis not present

## 2017-02-10 DIAGNOSIS — M1990 Unspecified osteoarthritis, unspecified site: Secondary | ICD-10-CM | POA: Diagnosis not present

## 2017-04-30 DIAGNOSIS — M9903 Segmental and somatic dysfunction of lumbar region: Secondary | ICD-10-CM | POA: Diagnosis not present

## 2017-04-30 DIAGNOSIS — M9904 Segmental and somatic dysfunction of sacral region: Secondary | ICD-10-CM | POA: Diagnosis not present

## 2017-04-30 DIAGNOSIS — S53402A Unspecified sprain of left elbow, initial encounter: Secondary | ICD-10-CM | POA: Diagnosis not present

## 2017-04-30 DIAGNOSIS — M25522 Pain in left elbow: Secondary | ICD-10-CM | POA: Diagnosis not present

## 2017-05-13 DIAGNOSIS — M25522 Pain in left elbow: Secondary | ICD-10-CM | POA: Diagnosis not present

## 2017-05-13 DIAGNOSIS — M9904 Segmental and somatic dysfunction of sacral region: Secondary | ICD-10-CM | POA: Diagnosis not present

## 2017-05-13 DIAGNOSIS — M9903 Segmental and somatic dysfunction of lumbar region: Secondary | ICD-10-CM | POA: Diagnosis not present

## 2017-05-13 DIAGNOSIS — S53402A Unspecified sprain of left elbow, initial encounter: Secondary | ICD-10-CM | POA: Diagnosis not present

## 2017-08-12 DIAGNOSIS — M9903 Segmental and somatic dysfunction of lumbar region: Secondary | ICD-10-CM | POA: Diagnosis not present

## 2017-08-12 DIAGNOSIS — M545 Low back pain: Secondary | ICD-10-CM | POA: Diagnosis not present

## 2017-08-12 DIAGNOSIS — M9905 Segmental and somatic dysfunction of pelvic region: Secondary | ICD-10-CM | POA: Diagnosis not present

## 2017-08-12 DIAGNOSIS — M9904 Segmental and somatic dysfunction of sacral region: Secondary | ICD-10-CM | POA: Diagnosis not present

## 2017-09-24 DIAGNOSIS — M5416 Radiculopathy, lumbar region: Secondary | ICD-10-CM | POA: Diagnosis not present

## 2017-09-24 DIAGNOSIS — R030 Elevated blood-pressure reading, without diagnosis of hypertension: Secondary | ICD-10-CM | POA: Diagnosis not present

## 2017-09-24 DIAGNOSIS — Z6829 Body mass index (BMI) 29.0-29.9, adult: Secondary | ICD-10-CM | POA: Diagnosis not present

## 2018-02-04 DIAGNOSIS — Z125 Encounter for screening for malignant neoplasm of prostate: Secondary | ICD-10-CM | POA: Diagnosis not present

## 2018-02-04 DIAGNOSIS — Z0000 Encounter for general adult medical examination without abnormal findings: Secondary | ICD-10-CM | POA: Diagnosis not present

## 2018-02-11 DIAGNOSIS — K909 Intestinal malabsorption, unspecified: Secondary | ICD-10-CM | POA: Diagnosis not present

## 2018-02-11 DIAGNOSIS — E291 Testicular hypofunction: Secondary | ICD-10-CM | POA: Diagnosis not present

## 2018-02-11 DIAGNOSIS — M1990 Unspecified osteoarthritis, unspecified site: Secondary | ICD-10-CM | POA: Diagnosis not present

## 2018-02-11 DIAGNOSIS — F321 Major depressive disorder, single episode, moderate: Secondary | ICD-10-CM | POA: Diagnosis not present

## 2018-02-11 DIAGNOSIS — Z1389 Encounter for screening for other disorder: Secondary | ICD-10-CM | POA: Diagnosis not present

## 2018-02-11 DIAGNOSIS — Z0000 Encounter for general adult medical examination without abnormal findings: Secondary | ICD-10-CM | POA: Diagnosis not present

## 2019-04-14 DIAGNOSIS — Z1159 Encounter for screening for other viral diseases: Secondary | ICD-10-CM | POA: Diagnosis not present

## 2019-04-14 DIAGNOSIS — J302 Other seasonal allergic rhinitis: Secondary | ICD-10-CM | POA: Diagnosis not present

## 2019-12-29 ENCOUNTER — Ambulatory Visit: Payer: 59 | Attending: Internal Medicine

## 2019-12-29 DIAGNOSIS — Z23 Encounter for immunization: Secondary | ICD-10-CM

## 2019-12-29 NOTE — Progress Notes (Signed)
   Covid-19 Vaccination Clinic  Name:  Tyler Cantrell    MRN: [id number] DOB: September 30, 1966  12/29/2019  Mr. Tyler Cantrell was observed post Covid-19 immunization for 15 minutes without incident. He was provided with Vaccine Information Sheet and instruction to access the V-Safe system.   Mr. Tyler Cantrell was instructed to call 911 with any severe reactions post vaccine: Marland Kitchen Difficulty breathing  . Swelling of face and throat  . A fast heartbeat  . A bad rash all over body  . Dizziness and weakness   Immunizations Administered    Name Date Dose VIS Date Route   Pfizer COVID-19 Vaccine 12/29/2019 10:54 AM 0.3 mL 09/16/2019 Intramuscular   Manufacturer: [employer], [employer]   Lot: [id number]   NDC: [address]-8

## 2020-01-23 ENCOUNTER — Ambulatory Visit: Payer: 59 | Attending: Internal Medicine

## 2020-01-23 DIAGNOSIS — Z23 Encounter for immunization: Secondary | ICD-10-CM

## 2020-01-23 NOTE — Progress Notes (Signed)
   Covid-19 Vaccination Clinic  Name:  Tyler Cantrell    MRN: [id number] DOB: [birth year]  01/23/2020  [name] was observed post Covid-19 immunization for 15 minutes without incident. He was provided with Vaccine Information Sheet and instruction to access the V-Safe system.   [name] was instructed to call 911 with any severe reactions post vaccine: Marland Kitchen Difficulty breathing  . Swelling of face and throat  . A fast heartbeat  . A bad rash all over body  . Dizziness and weakness   Immunizations Administered    Name Date Dose VIS Date Route   Pfizer COVID-19 Vaccine 01/23/2020 10:19 AM 0.3 mL 11/30/2018 Intramuscular   Manufacturer: [employer], [employer]   Lot: [id number]   NDC: [address]-7

## 2020-12-05 ENCOUNTER — Other Ambulatory Visit: Payer: Self-pay

## 2020-12-05 ENCOUNTER — Ambulatory Visit (INDEPENDENT_AMBULATORY_CARE_PROVIDER_SITE_OTHER): Payer: 59 | Admitting: Internal Medicine

## 2020-12-05 ENCOUNTER — Encounter: Payer: Self-pay | Admitting: Internal Medicine

## 2020-12-05 VITALS — BP 124/82 | HR 81 | Temp 98.3°F | Ht 73.0 in | Wt 216.0 lb

## 2020-12-05 DIAGNOSIS — Z0000 Encounter for general adult medical examination without abnormal findings: Secondary | ICD-10-CM | POA: Diagnosis not present

## 2020-12-05 DIAGNOSIS — Z1211 Encounter for screening for malignant neoplasm of colon: Secondary | ICD-10-CM

## 2020-12-05 DIAGNOSIS — K7581 Nonalcoholic steatohepatitis (NASH): Secondary | ICD-10-CM | POA: Diagnosis not present

## 2020-12-05 DIAGNOSIS — E781 Pure hyperglyceridemia: Secondary | ICD-10-CM

## 2020-12-05 DIAGNOSIS — K219 Gastro-esophageal reflux disease without esophagitis: Secondary | ICD-10-CM

## 2020-12-05 DIAGNOSIS — Z0001 Encounter for general adult medical examination with abnormal findings: Secondary | ICD-10-CM | POA: Insufficient documentation

## 2020-12-05 LAB — CBC WITH DIFFERENTIAL/PLATELET
Basophils Absolute: 0.1 10*3/uL (ref 0.0–0.1)
Basophils Relative: 0.8 % (ref 0.0–3.0)
Eosinophils Absolute: 0.2 10*3/uL (ref 0.0–0.7)
Eosinophils Relative: 2.2 % (ref 0.0–5.0)
HCT: 45.2 % (ref 39.0–52.0)
Hemoglobin: 15.4 g/dL (ref 13.0–17.0)
Lymphocytes Relative: 22.6 % (ref 12.0–46.0)
Lymphs Abs: 1.7 10*3/uL (ref 0.7–4.0)
MCHC: 34.1 g/dL (ref 30.0–36.0)
MCV: 91.7 fl (ref 78.0–100.0)
Monocytes Absolute: 0.4 10*3/uL (ref 0.1–1.0)
Monocytes Relative: 5.5 % (ref 3.0–12.0)
Neutro Abs: 5.2 10*3/uL (ref 1.4–7.7)
Neutrophils Relative %: 68.9 % (ref 43.0–77.0)
Platelets: 270 10*3/uL (ref 150.0–400.0)
RBC: 4.93 Mil/uL (ref 4.22–5.81)
RDW: 13.4 % (ref 11.5–15.5)
WBC: 7.6 10*3/uL (ref 4.0–10.5)

## 2020-12-05 LAB — HEPATIC FUNCTION PANEL
ALT: 33 U/L (ref 0–53)
AST: 21 U/L (ref 0–37)
Albumin: 4.7 g/dL (ref 3.5–5.2)
Alkaline Phosphatase: 50 U/L (ref 39–117)
Bilirubin, Direct: 0.3 mg/dL (ref 0.0–0.3)
Total Bilirubin: 1.6 mg/dL — ABNORMAL HIGH (ref 0.2–1.2)
Total Protein: 7.3 g/dL (ref 6.0–8.3)

## 2020-12-05 LAB — BASIC METABOLIC PANEL
BUN: 17 mg/dL (ref 6–23)
CO2: 23 mEq/L (ref 19–32)
Calcium: 9.6 mg/dL (ref 8.4–10.5)
Chloride: 108 mEq/L (ref 96–112)
Creatinine, Ser: 0.94 mg/dL (ref 0.40–1.50)
GFR: 93.14 mL/min (ref >60.00)
Glucose, Bld: 102 mg/dL — ABNORMAL HIGH (ref 70–99)
Potassium: 4.6 mEq/L (ref 3.5–5.1)
Sodium: 136 mEq/L (ref 135–145)

## 2020-12-05 LAB — LIPID PANEL
Cholesterol: 167 mg/dL (ref 0–200)
HDL: 54.6 mg/dL (ref >39.00)
LDL Cholesterol: 93 mg/dL (ref 0–99)
NonHDL: 112.71
Total CHOL/HDL Ratio: 3
Triglycerides: 99 mg/dL (ref 0.0–149.0)
VLDL: 19.8 mg/dL (ref 0.0–40.0)

## 2020-12-05 LAB — PROTIME-INR
INR: 1 ratio (ref 0.8–1.0)
Prothrombin Time: 11.6 s (ref 9.6–13.1)

## 2020-12-05 MED ORDER — ESOMEPRAZOLE MAGNESIUM 20 MG PO CPDR: 20.0000 mg | DELAYED_RELEASE_CAPSULE | Freq: Every day | ORAL | 1 refills | Status: DC

## 2020-12-05 NOTE — Patient Instructions (Signed)

## 2020-12-05 NOTE — Progress Notes (Signed)
Subjective:  Patient ID: Tyler Cantrell, male    DOB: 18-Feb-1966  Age: 55 y.o. MRN: [id number]  CC: Annual Exam, Hyperlipidemia, and Gastroesophageal Reflux  This visit occurred during the SARS-CoV-2 public health emergency.  Safety protocols were in place, including screening questions prior to the visit, additional usage of staff PPE, and extensive cleaning of exam room while observing appropriate contact time as indicated for disinfecting solutions.    HPI Tyler Cantrell presents for a CPX and to establish.  He tells me he saw a cardiologist about 6 weeks ago.  Prior to the visit with the cardiologist he had been experiencing chest pain, heartburn, and fluttering that only occurs at rest.  He tells me he is scheduled for an exercise tolerance test with the cardiologist in the next few weeks.  He does not have any exertional symptoms.  He has a history of chronic right upper quadrant abdominal pain.  He has been told this is related to fatty liver disease.  He has had his gallbladder removed.  He denies nausea, vomiting, loss of appetite, or weight loss.  History Tyler Cantrell has a past medical history of Anxiety, GERD (gastroesophageal reflux disease), and Pancreatitis (off and off last 3-4 months).   He has a past surgical history that includes Appendectomy; Back surgery; Esophagogastroduodenoscopy (egd) with propofol (N/A, 11/02/2013); and EUS (N/A, 11/02/2013).   His family history includes Alcohol abuse in his brother; Breast cancer in his mother; Prostate cancer in his father.He reports that he has never smoked. He has never used smokeless tobacco. He reports current alcohol use of about 20.0 standard drinks of alcohol per week. He reports that he does not use drugs.  Outpatient Medications Prior to Visit  Medication Sig Dispense Refill  . beta carotene w/minerals (OCUVITE) tablet Take 1 tablet by mouth daily.    Marland Kitchen glucosamine-chondroitin 500-400 MG tablet Take 1 tablet by mouth 3 (three)  times daily.    . Omega-3 Fatty Acids (FISH OIL) 1000 MG CAPS Take by mouth.    . Testosterone 20.25 MG/1.25GM (1.62%) GEL Place 1 application onto the skin daily.    Marland Kitchen esomeprazole (NEXIUM) 20 MG capsule Take 20 mg by mouth daily at 12 noon.    Marland Kitchen HYDROcodone-acetaminophen (NORCO/VICODIN) 5-325 MG per tablet Take 1 tablet by mouth every 6 (six) hours as needed for moderate pain.    . Pancrelipase, Lip-Prot-Amyl, 24000 UNITS CPEP Take 24,000 Units by mouth daily.    . sertraline (ZOLOFT) 50 MG tablet Take 50 mg by mouth daily with breakfast.     No facility-administered medications prior to visit.    ROS Review of Systems  Constitutional: Positive for unexpected weight change (wt gain). Negative for diaphoresis and fatigue.  HENT: Negative.   Eyes: Negative.   Respiratory: Negative for cough, chest tightness, shortness of breath and wheezing.   Cardiovascular: Positive for chest pain and palpitations. Negative for leg swelling.  Gastrointestinal: Positive for abdominal pain. Negative for blood in stool, diarrhea, nausea and vomiting.  Endocrine: Negative.   Genitourinary: Negative.  Negative for difficulty urinating, flank pain, hematuria, scrotal swelling and testicular pain.  Musculoskeletal: Positive for arthralgias and back pain. Negative for myalgias.       Chronic LBP  Skin: Negative.   Neurological: Negative.  Negative for dizziness and weakness.  Hematological: Negative for adenopathy. Does not bruise/bleed easily.  Psychiatric/Behavioral: Negative.     Objective:  BP 124/82   Pulse 81   Temp 98.3 F (36.8 C) (Oral)  Ht 6\' 1"  (1.854 m)   Wt 216 lb (98 kg)   SpO2 95%   BMI 28.50 kg/m   Physical Exam Vitals reviewed.  Constitutional:      Appearance: Normal appearance.  HENT:     Right Ear: Tympanic membrane normal.     Nose: Nose normal.     Mouth/Throat:     Mouth: Mucous membranes are moist.  Eyes:     General: No scleral icterus.    Conjunctiva/sclera:  Conjunctivae normal.  Cardiovascular:     Rate and Rhythm: Normal rate and regular rhythm.     Pulses: Normal pulses.     Heart sounds: No murmur heard. No friction rub. No gallop.   Pulmonary:     Effort: Pulmonary effort is normal.     Breath sounds: No stridor. No wheezing, rhonchi or rales.  Abdominal:     General: Abdomen is flat.     Palpations: There is no mass.     Tenderness: There is no abdominal tenderness. There is no guarding or rebound.     Hernia: No hernia is present. There is no hernia in the left inguinal area or right inguinal area.  Genitourinary:    Pubic Area: No rash.      Penis: Normal and circumcised.      Testes: Normal.     Epididymis:     Right: Normal. No mass.     Left: Normal. No mass.     Prostate: Normal. Not enlarged, not tender and no nodules present.     Rectum: Normal. Guaiac result negative. No mass, tenderness, anal fissure, external hemorrhoid or internal hemorrhoid. Normal anal tone.  Musculoskeletal:        General: Normal range of motion.     Cervical back: Neck supple.     Right lower leg: No edema.     Left lower leg: No edema.  Lymphadenopathy:     Cervical: No cervical adenopathy.     Lower Body: No right inguinal adenopathy. No left inguinal adenopathy.  Skin:    General: Skin is warm and dry.     Coloration: Skin is not pale.  Neurological:     General: No focal deficit present.     Mental Status: He is alert.  Psychiatric:        Mood and Affect: Mood normal.        Behavior: Behavior normal.     Lab Results  Component Value Date   WBC 7.6 12/05/2020   HGB 15.4 12/05/2020   HCT 45.2 12/05/2020   PLT 270.0 12/05/2020   GLUCOSE 102 (H) 12/05/2020   CHOL 167 12/05/2020   TRIG 99.0 12/05/2020   HDL 54.60 12/05/2020   LDLCALC 93 12/05/2020   ALT 33 12/05/2020   AST 21 12/05/2020   NA 136 12/05/2020   K 4.6 12/05/2020   CL 108 12/05/2020   CREATININE 0.94 12/05/2020   BUN 17 12/05/2020   CO2 23 12/05/2020   PSA  0.98 12/05/2020   INR 1.0 12/05/2020    Assessment & Plan:   Tyler Cantrell was seen today for annual exam, hyperlipidemia and gastroesophageal reflux.  Diagnoses and all orders for this visit:  Gastroesophageal reflux disease without esophagitis- This may be causing his CP. I have asked him to start taking a PPI. -     CBC with Differential/Platelet; Future -     Basic metabolic panel; Future -     esomeprazole (NEXIUM) 20 MG capsule; Take 1 capsule (20  mg total) by mouth daily at 12 noon. -     Basic metabolic panel -     CBC with Differential/Platelet  Routine general medical examination at a health care facility- Exam completed, labs reviewed - statin tx is not indicated, he refused a flu vax, cancer screenings addressed, pt ed material was give, -     Lipid panel; Future -     PSA; Future -     Hepatitis C antibody; Future -     HIV Antibody (routine testing w rflx); Future -     HIV Antibody (routine testing w rflx) -     Hepatitis C antibody -     PSA -     Lipid panel  NASH (nonalcoholic steatohepatitis)- His LFTs are normal now. -     Basic metabolic panel; Future -     Hepatic function panel; Future -     Protime-INR; Future -     Protime-INR -     Hepatic function panel -     Basic metabolic panel  Pure hyperglyceridemia- His trigs are normal now. -     Basic metabolic panel; Future -     Basic metabolic panel  Colon cancer screening -     Cologuard   I have discontinued Nyshaun B. Wulf's sertraline, Pancrelipase (Lip-Prot-Amyl), and HYDROcodone-acetaminophen. I have also changed his esomeprazole. Additionally, I am having him maintain his Testosterone, beta carotene w/minerals, glucosamine-chondroitin, and Fish Oil.  Meds ordered this encounter  Medications  . esomeprazole (NEXIUM) 20 MG capsule    Sig: Take 1 capsule (20 mg total) by mouth daily at 12 noon.    Dispense:  90 capsule    Refill:  1     Follow-up: Return in about 6 months (around  06/07/2021).  Tyler Linger, MD

## 2020-12-06 ENCOUNTER — Encounter: Payer: Self-pay | Admitting: Internal Medicine

## 2020-12-06 LAB — PSA: PSA: 0.98 ng/mL (ref 0.10–4.00)

## 2020-12-06 LAB — HEPATITIS C ANTIBODY
Hepatitis C Ab: NONREACTIVE
SIGNAL TO CUT-OFF: 0.01 (ref <1.00)

## 2020-12-06 LAB — HIV ANTIBODY (ROUTINE TESTING W REFLEX): HIV 1&2 Ab, 4th Generation: NONREACTIVE

## 2020-12-27 LAB — COLOGUARD: Cologuard: NEGATIVE

## 2021-10-28 ENCOUNTER — Other Ambulatory Visit: Payer: Self-pay

## 2021-10-28 ENCOUNTER — Emergency Department (HOSPITAL_BASED_OUTPATIENT_CLINIC_OR_DEPARTMENT_OTHER): Payer: 59

## 2021-10-28 ENCOUNTER — Inpatient Hospital Stay (HOSPITAL_BASED_OUTPATIENT_CLINIC_OR_DEPARTMENT_OTHER)
Admission: EM | Admit: 2021-10-28 | Discharge: 2021-10-30 | DRG: 392 | Disposition: A | Discharge status: Home / Self Care | Payer: 59 | Attending: Internal Medicine | Admitting: Internal Medicine

## 2021-10-28 ENCOUNTER — Encounter (HOSPITAL_BASED_OUTPATIENT_CLINIC_OR_DEPARTMENT_OTHER): Payer: Self-pay | Admitting: Emergency Medicine

## 2021-10-28 DIAGNOSIS — Z9049 Acquired absence of other specified parts of digestive tract: Secondary | ICD-10-CM

## 2021-10-28 DIAGNOSIS — F17290 Nicotine dependence, other tobacco product, uncomplicated: Secondary | ICD-10-CM | POA: Diagnosis present

## 2021-10-28 DIAGNOSIS — R17 Unspecified jaundice: Secondary | ICD-10-CM | POA: Diagnosis present

## 2021-10-28 DIAGNOSIS — K5792 Diverticulitis of intestine, part unspecified, without perforation or abscess without bleeding: Secondary | ICD-10-CM | POA: Diagnosis not present

## 2021-10-28 DIAGNOSIS — Z79890 Hormone replacement therapy (postmenopausal): Secondary | ICD-10-CM

## 2021-10-28 DIAGNOSIS — Z7141 Alcohol abuse counseling and surveillance of alcoholic: Secondary | ICD-10-CM

## 2021-10-28 DIAGNOSIS — Z811 Family history of alcohol abuse and dependence: Secondary | ICD-10-CM

## 2021-10-28 DIAGNOSIS — E872 Acidosis, unspecified: Secondary | ICD-10-CM | POA: Diagnosis present

## 2021-10-28 DIAGNOSIS — Z880 Allergy status to penicillin: Secondary | ICD-10-CM

## 2021-10-28 DIAGNOSIS — Z789 Other specified health status: Secondary | ICD-10-CM | POA: Diagnosis present

## 2021-10-28 DIAGNOSIS — R1030 Lower abdominal pain, unspecified: Secondary | ICD-10-CM | POA: Diagnosis not present

## 2021-10-28 DIAGNOSIS — E871 Hypo-osmolality and hyponatremia: Secondary | ICD-10-CM | POA: Diagnosis present

## 2021-10-28 DIAGNOSIS — K5720 Diverticulitis of large intestine with perforation and abscess without bleeding: Secondary | ICD-10-CM | POA: Diagnosis not present

## 2021-10-28 DIAGNOSIS — R109 Unspecified abdominal pain: Secondary | ICD-10-CM | POA: Diagnosis not present

## 2021-10-28 DIAGNOSIS — Z20822 Contact with and (suspected) exposure to covid-19: Secondary | ICD-10-CM | POA: Diagnosis present

## 2021-10-28 DIAGNOSIS — Z79899 Other long term (current) drug therapy: Secondary | ICD-10-CM

## 2021-10-28 DIAGNOSIS — K219 Gastro-esophageal reflux disease without esophagitis: Secondary | ICD-10-CM | POA: Diagnosis present

## 2021-10-28 DIAGNOSIS — R3915 Urgency of urination: Secondary | ICD-10-CM | POA: Diagnosis present

## 2021-10-28 DIAGNOSIS — F419 Anxiety disorder, unspecified: Secondary | ICD-10-CM | POA: Diagnosis present

## 2021-10-28 DIAGNOSIS — F1090 Alcohol use, unspecified, uncomplicated: Secondary | ICD-10-CM | POA: Diagnosis present

## 2021-10-28 LAB — CBC
HCT: 46.7 % (ref 39.0–52.0)
Hemoglobin: 15.7 g/dL (ref 13.0–17.0)
MCH: 30.7 pg (ref 26.0–34.0)
MCHC: 33.6 g/dL (ref 30.0–36.0)
MCV: 91.4 fL (ref 80.0–100.0)
Platelets: 232 10*3/uL (ref 150–400)
RBC: 5.11 MIL/uL (ref 4.22–5.81)
RDW: 12.3 % (ref 11.5–15.5)
WBC: 14.5 10*3/uL — ABNORMAL HIGH (ref 4.0–10.5)
nRBC: 0 % (ref 0.0–0.2)

## 2021-10-28 LAB — COMPREHENSIVE METABOLIC PANEL
ALT: 19 U/L (ref 0–44)
AST: 12 U/L — ABNORMAL LOW (ref 15–41)
Albumin: 4.5 g/dL (ref 3.5–5.0)
Alkaline Phosphatase: 40 U/L (ref 38–126)
Anion gap: 11 (ref 5–15)
BUN: 15 mg/dL (ref 6–20)
CO2: 22 mmol/L (ref 22–32)
Calcium: 9.1 mg/dL (ref 8.9–10.3)
Chloride: 101 mmol/L (ref 98–111)
Creatinine, Ser: 0.86 mg/dL (ref 0.61–1.24)
GFR, Estimated: >60 mL/min (ref >60)
Glucose, Bld: 122 mg/dL — ABNORMAL HIGH (ref 70–99)
Potassium: 4 mmol/L (ref 3.5–5.1)
Sodium: 134 mmol/L — ABNORMAL LOW (ref 135–145)
Total Bilirubin: 2.9 mg/dL — ABNORMAL HIGH (ref 0.3–1.2)
Total Protein: 7.6 g/dL (ref 6.5–8.1)

## 2021-10-28 LAB — URINALYSIS, ROUTINE W REFLEX MICROSCOPIC
Bilirubin Urine: NEGATIVE
Glucose, UA: NEGATIVE mg/dL
Hgb urine dipstick: NEGATIVE
Ketones, ur: NEGATIVE mg/dL
Leukocytes,Ua: NEGATIVE
Nitrite: NEGATIVE
Protein, ur: 30 mg/dL — AB
Specific Gravity, Urine: 1.028 (ref 1.005–1.030)
pH: 5.5 (ref 5.0–8.0)

## 2021-10-28 LAB — LIPASE, BLOOD: Lipase: <10 U/L — ABNORMAL LOW (ref 11–51)

## 2021-10-28 LAB — RESP PANEL BY RT-PCR (FLU A&B, COVID) ARPGX2
Influenza A by PCR: NEGATIVE
Influenza B by PCR: NEGATIVE
SARS Coronavirus 2 by RT PCR: NEGATIVE

## 2021-10-28 MED ORDER — SODIUM CHLORIDE 0.9 % IV SOLN: INTRAVENOUS | Status: DC | PRN

## 2021-10-28 MED ORDER — METRONIDAZOLE 500 MG/100ML IV SOLN
500.0000 mg | Freq: Once | INTRAVENOUS | Status: AC
  Administered 2021-10-28: 500 mg via INTRAVENOUS
  Filled 2021-10-28: qty 100

## 2021-10-28 MED ORDER — HYDROMORPHONE HCL 1 MG/ML IJ SOLN
1.0000 mg | Freq: Once | INTRAMUSCULAR | Status: AC
  Administered 2021-10-28: 1 mg via INTRAVENOUS
  Filled 2021-10-28: qty 1

## 2021-10-28 MED ORDER — CIPROFLOXACIN IN D5W 400 MG/200ML IV SOLN
400.0000 mg | Freq: Once | INTRAVENOUS | Status: AC
  Administered 2021-10-28: 400 mg via INTRAVENOUS
  Filled 2021-10-28: qty 200

## 2021-10-28 MED ORDER — IOHEXOL 300 MG/ML  SOLN
100.0000 mL | Freq: Once | INTRAMUSCULAR | Status: AC | PRN
  Administered 2021-10-28: 100 mL via INTRAVENOUS

## 2021-10-28 MED ORDER — MORPHINE SULFATE (PF) 4 MG/ML IV SOLN
6.0000 mg | Freq: Once | INTRAVENOUS | Status: AC
  Administered 2021-10-28: 6 mg via INTRAVENOUS
  Filled 2021-10-28: qty 2

## 2021-10-28 MED ORDER — ONDANSETRON HCL 4 MG/2ML IJ SOLN
4.0000 mg | Freq: Once | INTRAMUSCULAR | Status: AC
Start: 2021-10-28 — End: 2021-10-28
  Administered 2021-10-28: 4 mg via INTRAVENOUS
  Filled 2021-10-28: qty 2

## 2021-10-28 NOTE — Plan of Care (Signed)
[facility] will assume care on arrival to accepting facility. Until arrival, care as per EDP. However, [facility] available 24/7 for questions and assistance.  Nursing staff, please page [facility] Admits and Consults ([phone]) as soon as the patient arrives to the hospital.   

## 2021-10-28 NOTE — ED Notes (Signed)
Patient transported to CT 

## 2021-10-28 NOTE — ED Provider Notes (Signed)
[facility] DEPT Provider Note   CSN: [id number] Arrival date & time: 10/28/21  1434     History  Chief Complaint  Patient presents with   Fever   Abdominal Pain    Tyler Cantrell is a 56 y.o. male.  56 year old male presents with several days of left lower quadrant abdominal pain along with low-grade fever x2 days.  Has had nausea but no vomiting.  No bloody stools.  Denies any hematuria but does note urinary urgency.  Denies any dysuria.  No prior history of same.  Used laxatives without relief.      Home Medications Prior to Admission medications   Medication Sig Start Date End Date Taking? Authorizing Provider  beta carotene w/minerals (OCUVITE) tablet Take 1 tablet by mouth daily.    [provider]  esomeprazole (NEXIUM) 20 MG capsule Take 1 capsule (20 mg total) by mouth daily at 12 noon. 12/05/20   Janith Lima, MD  glucosamine-chondroitin 500-400 MG tablet Take 1 tablet by mouth 3 (three) times daily.    [provider]  Omega-3 Fatty Acids (FISH OIL) 1000 MG CAPS Take by mouth.    [provider]  Testosterone 20.25 MG/1.25GM (1.62%) GEL Place 1 application onto the skin daily.    [provider]      Allergies    Penicillins    Review of Systems   Review of Systems  All other systems reviewed and are negative.  Physical Exam Updated Vital Signs BP 122/71    Pulse 81    Temp 99 F (37.2 C)    Resp 18    Ht 1.854 m (6\' 1" )    Wt 97.5 kg    SpO2 97%    BMI 28.37 kg/m  Physical Exam Vitals and nursing note reviewed.  Constitutional:      General: He is not in acute distress.    Appearance: Normal appearance. He is well-developed. He is not toxic-appearing.  HENT:     Head: Normocephalic and atraumatic.  Eyes:     General: Lids are normal.     Conjunctiva/sclera: Conjunctivae normal.     Pupils: Pupils are equal, round, and reactive to light.  Neck:     Thyroid: No thyroid mass.     Trachea:  No tracheal deviation.  Cardiovascular:     Rate and Rhythm: Normal rate and regular rhythm.     Heart sounds: Normal heart sounds. No murmur heard.   No gallop.  Pulmonary:     Effort: Pulmonary effort is normal. No respiratory distress.     Breath sounds: Normal breath sounds. No stridor. No decreased breath sounds, wheezing, rhonchi or rales.  Abdominal:     General: There is no distension.     Palpations: Abdomen is soft.     Tenderness: There is abdominal tenderness in the left lower quadrant. There is guarding. There is no rebound.    Musculoskeletal:        General: No tenderness. Normal range of motion.     Cervical back: Normal range of motion and neck supple.  Skin:    General: Skin is warm and dry.     Findings: No abrasion or rash.  Neurological:     Mental Status: He is alert and oriented to person, place, and time. Mental status is at baseline.     GCS: GCS eye subscore is 4. GCS verbal subscore is 5. GCS motor subscore is 6.     Cranial Nerves:  No cranial nerve deficit.     Sensory: No sensory deficit.     Motor: Motor function is intact.  Psychiatric:        Attention and Perception: Attention normal.        Speech: Speech normal.        Behavior: Behavior normal.    ED Results / Procedures / Treatments   Labs (all labs ordered are listed, but only abnormal results are displayed) Labs Reviewed  LIPASE, BLOOD - Abnormal; Notable for the following components:      Result Value   Lipase <10 (*)    All other components within normal limits  COMPREHENSIVE METABOLIC PANEL - Abnormal; Notable for the following components:   Sodium 134 (*)    Glucose, Bld 122 (*)    AST 12 (*)    Total Bilirubin 2.9 (*)    All other components within normal limits  CBC - Abnormal; Notable for the following components:   WBC 14.5 (*)    All other components within normal limits  URINALYSIS, ROUTINE W REFLEX MICROSCOPIC - Abnormal; Notable for the following components:    Protein, ur 30 (*)    All other components within normal limits  RESP PANEL BY RT-PCR (FLU A&B, COVID) ARPGX2    EKG None  Radiology No results found.  Procedures Procedures    Medications Ordered in ED Medications  morphine 4 MG/ML injection 6 mg (has no administration in time range)  ondansetron (ZOFRAN) injection 4 mg (has no administration in time range)    ED Course/ Medical Decision Making/ A&P                           Medical Decision Making Amount and/or Complexity of Data Reviewed Labs: ordered. Radiology: ordered.  Risk Prescription drug management.   Patient medicated for pain here and feels better.  Abdominal CT consistent with diverticulitis with microperforation.  Will place on IV antibiotics here.  Will consult hospitalist for admission.  He has no signs of acute abdomen.  Old records reviewed.  Case discussed with his wife who was in the room        Final Clinical Impression(s) / ED Diagnoses Final diagnoses:  None    Rx / DC Orders ED Discharge Orders     None         Lacretia Leigh, MD 10/28/21 [phone]

## 2021-10-28 NOTE — ED Triage Notes (Signed)
Pt via pov from home with lower abdominal pain and low-grade fever since Friday, worsening on Saturday. Pt states he has had increased urgency for urination as well. Pt reports hx of pancreatic issues, has had cholecystectomy and appendectomy. Pt endorses anorexia, denies n/v/d, but endorses loose stools. Pt alert & oriented, nad noted.

## 2021-10-29 ENCOUNTER — Encounter (HOSPITAL_COMMUNITY): Payer: Self-pay | Admitting: Internal Medicine

## 2021-10-29 DIAGNOSIS — K219 Gastro-esophageal reflux disease without esophagitis: Secondary | ICD-10-CM | POA: Diagnosis not present

## 2021-10-29 DIAGNOSIS — Z20822 Contact with and (suspected) exposure to covid-19: Secondary | ICD-10-CM | POA: Diagnosis not present

## 2021-10-29 DIAGNOSIS — F17290 Nicotine dependence, other tobacco product, uncomplicated: Secondary | ICD-10-CM | POA: Diagnosis present

## 2021-10-29 DIAGNOSIS — F1090 Alcohol use, unspecified, uncomplicated: Secondary | ICD-10-CM | POA: Diagnosis present

## 2021-10-29 DIAGNOSIS — R69 Illness, unspecified: Secondary | ICD-10-CM | POA: Diagnosis not present

## 2021-10-29 DIAGNOSIS — Z880 Allergy status to penicillin: Secondary | ICD-10-CM | POA: Diagnosis not present

## 2021-10-29 DIAGNOSIS — Z811 Family history of alcohol abuse and dependence: Secondary | ICD-10-CM | POA: Diagnosis not present

## 2021-10-29 DIAGNOSIS — K5792 Diverticulitis of intestine, part unspecified, without perforation or abscess without bleeding: Secondary | ICD-10-CM | POA: Diagnosis not present

## 2021-10-29 DIAGNOSIS — Z79899 Other long term (current) drug therapy: Secondary | ICD-10-CM | POA: Diagnosis not present

## 2021-10-29 DIAGNOSIS — R3915 Urgency of urination: Secondary | ICD-10-CM | POA: Diagnosis not present

## 2021-10-29 DIAGNOSIS — Z7141 Alcohol abuse counseling and surveillance of alcoholic: Secondary | ICD-10-CM | POA: Diagnosis not present

## 2021-10-29 DIAGNOSIS — R109 Unspecified abdominal pain: Secondary | ICD-10-CM | POA: Diagnosis not present

## 2021-10-29 DIAGNOSIS — E872 Acidosis, unspecified: Secondary | ICD-10-CM | POA: Diagnosis not present

## 2021-10-29 DIAGNOSIS — Z789 Other specified health status: Secondary | ICD-10-CM | POA: Diagnosis present

## 2021-10-29 DIAGNOSIS — Z79890 Hormone replacement therapy (postmenopausal): Secondary | ICD-10-CM | POA: Diagnosis not present

## 2021-10-29 DIAGNOSIS — E871 Hypo-osmolality and hyponatremia: Secondary | ICD-10-CM | POA: Diagnosis not present

## 2021-10-29 DIAGNOSIS — F419 Anxiety disorder, unspecified: Secondary | ICD-10-CM | POA: Diagnosis present

## 2021-10-29 DIAGNOSIS — Z9049 Acquired absence of other specified parts of digestive tract: Secondary | ICD-10-CM | POA: Diagnosis not present

## 2021-10-29 DIAGNOSIS — R17 Unspecified jaundice: Secondary | ICD-10-CM | POA: Diagnosis not present

## 2021-10-29 DIAGNOSIS — K5720 Diverticulitis of large intestine with perforation and abscess without bleeding: Secondary | ICD-10-CM | POA: Diagnosis not present

## 2021-10-29 MED ORDER — METRONIDAZOLE 500 MG/100ML IV SOLN
500.0000 mg | Freq: Two times a day (BID) | INTRAVENOUS | Status: DC
  Administered 2021-10-29 – 2021-10-30 (×3): 500 mg via INTRAVENOUS
  Filled 2021-10-29 (×2): qty 100

## 2021-10-29 MED ORDER — ACETAMINOPHEN 650 MG RE SUPP: 650.0000 mg | Freq: Four times a day (QID) | RECTAL | Status: DC | PRN

## 2021-10-29 MED ORDER — ONDANSETRON HCL 4 MG/2ML IJ SOLN: 4.0000 mg | Freq: Four times a day (QID) | INTRAMUSCULAR | Status: DC | PRN

## 2021-10-29 MED ORDER — HYDROMORPHONE HCL 1 MG/ML IJ SOLN
1.0000 mg | Freq: Once | INTRAMUSCULAR | Status: AC
  Administered 2021-10-29: 04:00:00 1 mg via INTRAVENOUS
  Filled 2021-10-29: qty 1

## 2021-10-29 MED ORDER — LACTATED RINGERS IV SOLN: INTRAVENOUS | Status: DC

## 2021-10-29 MED ORDER — MORPHINE SULFATE (PF) 2 MG/ML IV SOLN
2.0000 mg | INTRAVENOUS | Status: DC | PRN
  Administered 2021-10-29 (×2): 2 mg via INTRAVENOUS
  Filled 2021-10-29 (×2): qty 1

## 2021-10-29 MED ORDER — OXYCODONE HCL 5 MG PO TABS: 5.0000 mg | ORAL_TABLET | ORAL | Status: DC | PRN

## 2021-10-29 MED ORDER — ONDANSETRON HCL 4 MG/2ML IJ SOLN
4.0000 mg | Freq: Once | INTRAMUSCULAR | Status: AC
  Administered 2021-10-29: 04:00:00 4 mg via INTRAVENOUS
  Filled 2021-10-29: qty 2

## 2021-10-29 MED ORDER — HYDRALAZINE HCL 20 MG/ML IJ SOLN: 10.0000 mg | INTRAMUSCULAR | Status: DC | PRN

## 2021-10-29 MED ORDER — HYDROMORPHONE HCL 1 MG/ML IJ SOLN
1.0000 mg | Freq: Once | INTRAMUSCULAR | Status: AC
  Administered 2021-10-29: 09:00:00 1 mg via INTRAVENOUS
  Filled 2021-10-29: qty 1

## 2021-10-29 MED ORDER — ONDANSETRON 4 MG PO TBDP: 4.0000 mg | ORAL_TABLET | Freq: Four times a day (QID) | ORAL | Status: DC | PRN

## 2021-10-29 MED ORDER — ACETAMINOPHEN 325 MG PO TABS: 650.0000 mg | ORAL_TABLET | Freq: Four times a day (QID) | ORAL | Status: DC | PRN

## 2021-10-29 MED ORDER — SODIUM CHLORIDE 0.9 % IV SOLN
2.0000 g | INTRAVENOUS | Status: DC
  Administered 2021-10-29 – 2021-10-30 (×2): 2 g via INTRAVENOUS
  Filled 2021-10-29 (×2): qty 20

## 2021-10-29 NOTE — Assessment & Plan Note (Addendum)
-  Patient's symptoms are c/w diverticulitis and his CT supports this as a diagnosis -His only SIRS criteria is leukocytosis (14.5) -CT shows microperforation -IV abx changed to PO -GI and surgery consults appreciated -clears started and patient can be d/c'd home this PM or in the AM if no increased pain

## 2021-10-29 NOTE — Consult Note (Signed)
Referring Provider: [facility] Primary Care Physician:  Janith Lima, MD Primary Gastroenterologist:  Dr. Paulita Fujita  Reason for Consultation:  Diverticulitis with microperforation  HPI: Tyler Cantrell is a 56 y.o. male with history of GERD presents for abdominal pain.  Patient states Saturday night (1/21) after he got home from watching a football game he began to have severe lower midline abdominal pain. He then spent Sunday laying in bed. States pain started midline and then radiated across entire lower abdomen. States he couldn't get comfortable due to the pain. Reports fever and chills as well. Tried to take Pepto-bismol with no relief. Has had small formed bowel movements. Denies hematochezia, notes dark stools after taking pepto bismol. Denies nausea/vomiting. Denies weight loss. Denies family history of colon cancer or other GI issues. Father has history of prostate cancer, brother has history of testicular cancer. Denies tobacco use. Drinks about a case of beer per week. Denies NSAID use.  EGD 10/2013: medium sized hiatal hernia, widely patent Schatzki's ring. Mild distal esophagus.  No previous history of colonoscopy  Past Medical History:  Diagnosis Date   Anxiety    GERD (gastroesophageal reflux disease)    Pancreatitis off and off last 3-4 months    Past Surgical History:  Procedure Laterality Date   APPENDECTOMY     BACK SURGERY     ESOPHAGOGASTRODUODENOSCOPY (EGD) WITH PROPOFOL N/A 11/02/2013   Procedure: ESOPHAGOGASTRODUODENOSCOPY (EGD) WITH PROPOFOL;  Surgeon: Arta Silence, MD;  Location: [facility];  Service: Endoscopy;  Laterality: N/A;   EUS N/A 11/02/2013   Procedure: ESOPHAGEAL ENDOSCOPIC ULTRASOUND (EUS) RADIAL;  Surgeon: Arta Silence, MD;  Location: [facility];  Service: Endoscopy;  Laterality: N/A;    Prior to Admission medications   Medication Sig Start Date End Date Taking? Authorizing Provider  calcium carbonate (TUMS - DOSED IN MG ELEMENTAL CALCIUM) 500 MG  chewable tablet Chew 1,000 mg by mouth 3 (three) times daily as needed for indigestion or heartburn.   Yes [provider]  ibuprofen (ADVIL) 200 MG tablet Take 400 mg by mouth every 6 (six) hours as needed for mild pain.   Yes [provider]    Scheduled Meds: Continuous Infusions:  cefTRIAXone (ROCEPHIN)  IV     And   metronidazole 500 mg (10/29/21 1125)   lactated ringers 75 mL/hr at 10/29/21 1123   PRN Meds:.acetaminophen **OR** acetaminophen, hydrALAZINE, morphine injection, ondansetron **OR** ondansetron (ZOFRAN) IV, oxyCODONE  Allergies as of 10/28/2021 - Review Complete 10/28/2021  Allergen Reaction Noted   Penicillins Hives 10/11/2013    Family History  Problem Relation Age of Onset   Breast cancer Mother    Prostate cancer Father    Alcohol abuse Brother     Social History   Socioeconomic History   Marital status: Married    Spouse name: Not on file   Number of children: Not on file   Years of education: Not on file   Highest education level: Not on file  Occupational History   Occupation: geologist  Tobacco Use   Smoking status: Never   Smokeless tobacco: Never  Vaping Use   Vaping Use: Never used  Substance and Sexual Activity   Alcohol use: Not Currently    Comment: 1 case per week   Drug use: No   Sexual activity: Yes    Partners: Female  Other Topics Concern   Not on file  Social History Narrative   Not on file   Social Determinants of Radio broadcast assistant  Strain: Not on file  Food Insecurity: Not on file  Transportation Needs: Not on file  Physical Activity: Not on file  Stress: Not on file  Social Connections: Not on file  Intimate Partner Violence: Not on file    Review of Systems: Review of Systems  Constitutional:  Positive for chills and fever. Negative for weight loss.  HENT:  Negative for hearing loss and tinnitus.   Eyes:  Negative for blurred vision and double vision.  Respiratory:  Negative for cough  and hemoptysis.   Cardiovascular:  Negative for chest pain and palpitations.  Gastrointestinal:  Positive for abdominal pain. Negative for blood in stool, constipation, diarrhea, heartburn, melena, nausea and vomiting.  Genitourinary:  Negative for dysuria and urgency.  Musculoskeletal:  Negative for myalgias and neck pain.  Skin:  Negative for itching and rash.  Neurological:  Negative for seizures and loss of consciousness.  Psychiatric/Behavioral:  Negative for substance abuse. The patient is not nervous/anxious.     Physical Exam:Physical Exam Constitutional:      Appearance: He is well-developed.  HENT:     Head: Normocephalic and atraumatic.     Nose: Nose normal. No congestion.     Mouth/Throat:     Mouth: Mucous membranes are moist.     Pharynx: Oropharynx is clear.  Eyes:     Extraocular Movements: Extraocular movements intact.     Conjunctiva/sclera: Conjunctivae normal.  Cardiovascular:     Rate and Rhythm: Normal rate and regular rhythm.     Pulses: Normal pulses.  Pulmonary:     Effort: Pulmonary effort is normal. No respiratory distress.  Abdominal:     General: Abdomen is flat. Bowel sounds are normal. There is no distension.     Palpations: Abdomen is soft. There is no mass.     Tenderness: There is abdominal tenderness (lower midline tenderness). There is no guarding or rebound.     Hernia: No hernia is present.  Musculoskeletal:        General: No swelling. Normal range of motion.     Cervical back: Normal range of motion and neck supple.  Skin:    General: Skin is warm and dry.  Neurological:     General: No focal deficit present.     Mental Status: He is alert and oriented to person, place, and time.  Psychiatric:        Mood and Affect: Mood normal.        Behavior: Behavior normal.        Thought Content: Thought content normal.        Judgment: Judgment normal.    Vital signs: Vitals:   10/29/21 0919 10/29/21 1114  BP: 125/84 136/87  Pulse: 65  66  Resp: 18 18  Temp: (!) 97.4 F (36.3 C) 97.7 F (36.5 C)  SpO2: 96% 95%        GI:  Lab Results: Recent Labs    10/28/21 1518  WBC 14.5*  HGB 15.7  HCT 46.7  PLT 232   BMET Recent Labs    10/28/21 1518  NA 134*  K 4.0  CL 101  CO2 22  GLUCOSE 122*  BUN 15  CREATININE 0.86  CALCIUM 9.1   LFT Recent Labs    10/28/21 1518  PROT 7.6  ALBUMIN 4.5  AST 12*  ALT 19  ALKPHOS 40  BILITOT 2.9*   PT/INR No results for input(s): LABPROT, INR in the last 72 hours.   Studies/Results: CT Abdomen Pelvis W  Contrast  Result Date: 10/28/2021 CLINICAL DATA:  Abdominal pain, nonlocalized EXAM: CT ABDOMEN AND PELVIS WITH CONTRAST TECHNIQUE: Multidetector CT imaging of the abdomen and pelvis was performed using the standard protocol following bolus administration of intravenous contrast. RADIATION DOSE REDUCTION: This exam was performed according to the departmental dose-optimization program which includes automated exposure control, adjustment of the mA and/or kV according to patient size and/or use of iterative reconstruction technique. CONTRAST:  131mL OMNIPAQUE IOHEXOL 300 MG/ML  SOLN COMPARISON:  CT abdomen and pelvis dated August 15th 2015 FINDINGS: Lower chest: No acute abnormality. Hepatobiliary: No focal liver abnormality is seen. Status post cholecystectomy. No biliary dilatation. Pancreas: Unremarkable. No pancreatic ductal dilatation or surrounding inflammatory changes. Spleen: Normal in size without focal abnormality. Adrenals/Urinary Tract: Bilateral adrenal glands are unremarkable. Kidneys enhance symmetrically with no evidence of hydronephrosis or nephrolithiasis. Bladder is unremarkable. Stomach/Bowel: Wall thickening of the sigmoid colon with marked adjacent inflammatory change and associated sigmoid diverticulosis. A few small locules of extraluminal gas are seen on series 6, image 85. Normal appearance of the stomach and small bowel. No evidence of obstruction.  Vascular/Lymphatic: No significant vascular findings are present. No enlarged abdominal or pelvic lymph nodes. Reproductive: Prostate is unremarkable. Other: No abdominal wall hernia or abnormality. No abdominopelvic ascites. Musculoskeletal: No acute or significant osseous findings. IMPRESSION: Findings compatible with acute sigmoid diverticulitis. A few adjacent small locules of extraluminal gas are seen, compatible with microperforation. Electronically Signed   By: Yetta Glassman M.D.   On: 10/28/2021 18:42    Impression: Diverticulitis with microperforation - T. Bili 2.9 - Leukocytosis with WBC 14.5 - hgb 15.7 - CT ab/pelvis with contrast 1/23: findings compatible with acute sigmoid diverticulitis, possible microperforation.   Plan: Discussion with patient and wife about diverticulitis including treatment and possible complications that can arise. Continue IV cipro and flagyl Appreciate surgery assistance Will continue to monitor patient for any changes. When diverticulitis is resolved, patient will need outpatient colonoscopy in 7-8 weeks. Continue supportive care. Recommend continued bowel rest and IVF for 24 hrs. [facility] will follow    LOS: 0 days   Kawan [place]adolid Radford Pax  PA-C 10/29/2021, 12:00 PM  Contact #  [phone]

## 2021-10-29 NOTE — ED Notes (Signed)
Patient is resting comfortably. 

## 2021-10-29 NOTE — Consult Note (Signed)
Tyler Cantrell 05-Aug-1966  [id number].    Requesting MD: Dr. Karmen Bongo Chief Complaint/Reason for Consult: Diverticulitis with microperf  HPI: Tyler Cantrell is a 56 y.o. male who presented to the ED on 1/23 with abdominal pain.  Patient reports that on Friday, 1/20 he began having a mild, dull pain in his suprapubic abdomen.  After watching a football game on Saturday his pain seemed to worsen, now radiating to the LLQ, becoming more constant and severe to a 7/10.  He reports associated subjective fever and chills.  He tried Pepto-Bismol and MiraLAX for this without any relief.  He was seen by telemedicine and recommended to go to the ED.  Work-up significant for WBC 14.5.  CT with sigmoid diverticulitis with microperforation.  He was admitted to [facility].  He reports that his pain is improved since admission and is currently a 2/10 at rest but will increase to a 5-6/10 when he sits up in bed or the area is palpated. He is passing flatus and had a bm yesterday. He denies history of similar symptoms in the past.  No prior history of diverticulitis.  No personal or family history of any colon cancer or IBD.  He has never had a colonoscopy before.  He did have a Cologuard screening last year that was negative. History of open appendectomy at the age of 39 and laparoscopic cholecystectomy.  He is not any blood thinners.  ROS: Review of Systems  Constitutional:  Positive for chills and fever.  Gastrointestinal:  Positive for abdominal pain. Negative for blood in stool, melena, nausea and vomiting.  Genitourinary:        No pneumaturia or flecks of stool in his urine  All other systems reviewed and are negative.  Family History  Problem Relation Age of Onset   Breast cancer Mother    Prostate cancer Father    Alcohol abuse Brother     Past Medical History:  Diagnosis Date   Anxiety    GERD (gastroesophageal reflux disease)    Pancreatitis off and off last 3-4 months    Past Surgical  History:  Procedure Laterality Date   APPENDECTOMY     BACK SURGERY     ESOPHAGOGASTRODUODENOSCOPY (EGD) WITH PROPOFOL N/A 11/02/2013   Procedure: ESOPHAGOGASTRODUODENOSCOPY (EGD) WITH PROPOFOL;  Surgeon: Arta Silence, MD;  Location: [facility];  Service: Endoscopy;  Laterality: N/A;   EUS N/A 11/02/2013   Procedure: ESOPHAGEAL ENDOSCOPIC ULTRASOUND (EUS) RADIAL;  Surgeon: Arta Silence, MD;  Location: [facility];  Service: Endoscopy;  Laterality: N/A;    Social History:  reports that he has never smoked. He has never used smokeless tobacco. He reports that he does not currently use alcohol. He reports that he does not use drugs. Smokes cigars Reports he finishes about a case of beer throughout the week Married  Allergies:  Allergies  Allergen Reactions   Penicillins Hives    Medications Prior to Admission  Medication Sig Dispense Refill   calcium carbonate (TUMS - DOSED IN MG ELEMENTAL CALCIUM) 500 MG chewable tablet Chew 1,000 mg by mouth 3 (three) times daily as needed for indigestion or heartburn.     ibuprofen (ADVIL) 200 MG tablet Take 400 mg by mouth every 6 (six) hours as needed for mild pain.       Physical Exam: Blood pressure 136/87, pulse 66, temperature 97.7 F (36.5 C), temperature source Oral, resp. rate 18, height 6\' 1"  (1.854 m), weight 97.5 kg, SpO2 95 %. General:  pleasant, WD/WN white male who is laying in bed in NAD HEENT: head is normocephalic, atraumatic.  Sclera are noninjected.  PERRL.  Ears and nose without any masses or lesions.  Mouth is pink and moist. Dentition fair Heart: regular, rate, and rhythm.  Normal s1,s2. No obvious murmurs, gallops, or rubs noted.  Palpable pedal pulses bilaterally  Lungs: CTAB, no wheezes, rhonchi, or rales noted.  Respiratory effort nonlabored Abd: Soft, mild distension, tenderness in the LLQ > suprapubic abdomen without peritonitis. +BS. No masses, hernias, or organomegaly. Prior open appendectomy scar noted MS: no  BUE/BLE edema, calves soft and nontender Skin: warm and dry with no masses, lesions, or rashes Psych: A&Ox4 with an appropriate affect Neuro: cranial nerves grossly intact, equal strength in BUE/BLE bilaterally, normal speech, thought process intact, moves all extremities, gait not assessed   Results for orders placed or performed during the hospital encounter of 10/28/21 (from the past 48 hour(s))  Lipase, blood     Status: Abnormal   Collection Time: 10/28/21  3:18 PM  Result Value Ref Range   Lipase <10 (L) 11 - 51 U/L    Comment: Performed at [facility], [address] [place], [place] [address]  Comprehensive metabolic panel     Status: Abnormal   Collection Time: 10/28/21  3:18 PM  Result Value Ref Range   Sodium 134 (L) 135 - 145 mmol/L   Potassium 4.0 3.5 - 5.1 mmol/L   Chloride 101 98 - 111 mmol/L   CO2 22 22 - 32 mmol/L   Glucose, Bld 122 (H) 70 - 99 mg/dL    Comment: Glucose reference range applies only to samples taken after fasting for at least 8 hours.   BUN 15 6 - 20 mg/dL   Creatinine, Ser 0.86 0.61 - 1.24 mg/dL   Calcium 9.1 8.9 - 10.3 mg/dL   Total Protein 7.6 6.5 - 8.1 g/dL   Albumin 4.5 3.5 - 5.0 g/dL   AST 12 (L) 15 - 41 U/L   ALT 19 0 - 44 U/L   Alkaline Phosphatase 40 38 - 126 U/L   Total Bilirubin 2.9 (H) 0.3 - 1.2 mg/dL   GFR, Estimated >60 >60 mL/min    Comment: (NOTE) Calculated using the CKD-EPI Creatinine Equation (2021)    Anion gap 11 5 - 15    Comment: Performed at [facility], [address] [place], [place] [address]  CBC     Status: Abnormal   Collection Time: 10/28/21  3:18 PM  Result Value Ref Range   WBC 14.5 (H) 4.0 - 10.5 K/uL   RBC 5.11 4.22 - 5.81 MIL/uL   Hemoglobin 15.7 13.0 - 17.0 g/dL   HCT 46.7 39.0 - 52.0 %   MCV 91.4 80.0 - 100.0 fL   MCH 30.7 26.0 - 34.0 pg   MCHC 33.6 30.0 - 36.0 g/dL   RDW 12.3 11.5 - 15.5 %   Platelets 232 150 - 400 K/uL   nRBC 0.0 0.0 - 0.2 %    Comment:  Performed at [facility], [address], [place], [place] [address]  Urinalysis, Routine w reflex microscopic Urine, Unspecified Source     Status: Abnormal   Collection Time: 10/28/21  3:18 PM  Result Value Ref Range   Color, Urine YELLOW YELLOW   APPearance CLEAR CLEAR   Specific Gravity, Urine 1.028 1.005 - 1.030   pH 5.5 5.0 - 8.0   Glucose, UA NEGATIVE NEGATIVE mg/dL   Hgb urine  dipstick NEGATIVE NEGATIVE   Bilirubin Urine NEGATIVE NEGATIVE   Ketones, ur NEGATIVE NEGATIVE mg/dL   Protein, ur 30 (A) NEGATIVE mg/dL   Nitrite NEGATIVE NEGATIVE   Leukocytes,Ua NEGATIVE NEGATIVE   RBC / HPF 0-5 0 - 5 RBC/hpf   WBC, UA 0-5 0 - 5 WBC/hpf   Mucus PRESENT     Comment: Performed at [facility], [address] [place], [place] [address]  Resp Panel by RT-PCR (Flu A&B, Covid) Nasopharyngeal Swab     Status: None   Collection Time: 10/28/21  5:58 PM   Specimen: Nasopharyngeal Swab; Nasopharyngeal(NP) swabs in vial transport medium  Result Value Ref Range   SARS Coronavirus 2 by RT PCR NEGATIVE NEGATIVE    Comment: (NOTE) SARS-CoV-2 target nucleic acids are NOT DETECTED.  The SARS-CoV-2 RNA is generally detectable in upper respiratory specimens during the acute phase of infection. The lowest concentration of SARS-CoV-2 viral copies this assay can detect is 138 copies/mL. A negative result does not preclude SARS-Cov-2 infection and should not be used as the sole basis for treatment or other patient management decisions. A negative result may occur with  improper specimen collection/handling, submission of specimen other than nasopharyngeal swab, presence of viral mutation(s) within the areas targeted by this assay, and inadequate number of viral copies(<138 copies/mL). A negative result must be combined with clinical observations, patient history, and epidemiological information. The expected result is Negative.  Fact Sheet for Patients:   EntrepreneurPulse.com.au  Fact Sheet for Healthcare Providers:  IncredibleEmployment.be  This test is no t yet approved or cleared by the [place] FDA and  has been authorized for detection and/or diagnosis of SARS-CoV-2 by FDA under an Emergency Use Authorization (EUA). This EUA will remain  in effect (meaning this test can be used) for the duration of the COVID-19 declaration under Section 564(b)(1) of the Act, 21 U.S.C.section 360bbb-3(b)(1), unless the authorization is terminated  or revoked sooner.       Influenza A by PCR NEGATIVE NEGATIVE   Influenza B by PCR NEGATIVE NEGATIVE    Comment: (NOTE) The Xpert Xpress SARS-CoV-2/FLU/RSV plus assay is intended as an aid in the diagnosis of influenza from Nasopharyngeal swab specimens and should not be used as a sole basis for treatment. Nasal washings and aspirates are unacceptable for Xpert Xpress SARS-CoV-2/FLU/RSV testing.  Fact Sheet for Patients: EntrepreneurPulse.com.au  Fact Sheet for Healthcare Providers: IncredibleEmployment.be  This test is not yet approved or cleared by the [place] FDA and has been authorized for detection and/or diagnosis of SARS-CoV-2 by FDA under an Emergency Use Authorization (EUA). This EUA will remain in effect (meaning this test can be used) for the duration of the COVID-19 declaration under Section 564(b)(1) of the Act, 21 U.S.C. section 360bbb-3(b)(1), unless the authorization is terminated or revoked.  Performed at [facility], [address] [place], [place] [address]    CT Abdomen Pelvis W Contrast  Result Date: 10/28/2021 CLINICAL DATA:  Abdominal pain, nonlocalized EXAM: CT ABDOMEN AND PELVIS WITH CONTRAST TECHNIQUE: Multidetector CT imaging of the abdomen and pelvis was performed using the standard protocol following bolus administration of intravenous contrast. RADIATION DOSE  REDUCTION: This exam was performed according to the departmental dose-optimization program which includes automated exposure control, adjustment of the mA and/or kV according to patient size and/or use of iterative reconstruction technique. CONTRAST:  170mL OMNIPAQUE IOHEXOL 300 MG/ML  SOLN COMPARISON:  CT abdomen and pelvis dated August 15th 2015 FINDINGS: Lower chest: No acute abnormality.  Hepatobiliary: No focal liver abnormality is seen. Status post cholecystectomy. No biliary dilatation. Pancreas: Unremarkable. No pancreatic ductal dilatation or surrounding inflammatory changes. Spleen: Normal in size without focal abnormality. Adrenals/Urinary Tract: Bilateral adrenal glands are unremarkable. Kidneys enhance symmetrically with no evidence of hydronephrosis or nephrolithiasis. Bladder is unremarkable. Stomach/Bowel: Wall thickening of the sigmoid colon with marked adjacent inflammatory change and associated sigmoid diverticulosis. A few small locules of extraluminal gas are seen on series 6, image 85. Normal appearance of the stomach and small bowel. No evidence of obstruction. Vascular/Lymphatic: No significant vascular findings are present. No enlarged abdominal or pelvic lymph nodes. Reproductive: Prostate is unremarkable. Other: No abdominal wall hernia or abnormality. No abdominopelvic ascites. Musculoskeletal: No acute or significant osseous findings. IMPRESSION: Findings compatible with acute sigmoid diverticulitis. A few adjacent small locules of extraluminal gas are seen, compatible with microperforation. Electronically Signed   By: Yetta Glassman M.D.   On: 10/28/2021 18:42    Anti-infectives (From admission, onward)    Start     Dose/Rate Route Frequency Ordered Stop   10/29/21 1200  cefTRIAXone (ROCEPHIN) 2 g in sodium chloride 0.9 % 100 mL IVPB       See Hyperspace for full Linked Orders Report.   2 g 200 mL/hr over 30 Minutes Intravenous Every 24 hours 10/29/21 1111 11/05/21 1159    10/29/21 1200  metroNIDAZOLE (FLAGYL) IVPB 500 mg       See Hyperspace for full Linked Orders Report.   500 mg 100 mL/hr over 60 Minutes Intravenous Every 12 hours 10/29/21 1111 11/05/21 1159   10/28/21 1915  ciprofloxacin (CIPRO) IVPB 400 mg        400 mg 200 mL/hr over 60 Minutes Intravenous  Once 10/28/21 1912 10/28/21 2100   10/28/21 1915  metroNIDAZOLE (FLAGYL) IVPB 500 mg        500 mg 100 mL/hr over 60 Minutes Intravenous  Once 10/28/21 1912 10/28/21 2200       Assessment/Plan Sigmoid diverticulitis with microperforation - No current indication for emergency surgery - Continue IV antibiotics - Okay with sips of clears from the floor - Hopefully patient will improve with conservative treatment.  Discussed if he was to fail to improve may obtain follow up CT scan to rule out abscess. We discussed if he was to fail to improve with conservative management or was to acutely worsen he may need surgery during admission that would likely result in a colectomy and colostomy.  - If patient improves with conservative therapies would recommend colonoscopy in ~6 weeks. He has already been seen by GI here.  - We will follow with you  FEN - NPO VTE - SCDs, okay for chemical prophylaxis from a general surgery standpoint ID - Rocephin/Flagyl Foley - None   Moderate Medical Decision Making  Jillyn Ledger, [facility] 10/29/2021, 1:05 PM Please see Amion for pager number during day hours 7:00am-4:30pm

## 2021-10-29 NOTE — Assessment & Plan Note (Addendum)
-  Patient with h/o ETOH use (abuse?) with prior mildly elevated bilirubin CMP as an outpatient- defer to GI further work up

## 2021-10-29 NOTE — H&P (Signed)
History and Physical    Patient: Tyler Cantrell MEQ:[id number] DOB: Feb 20, 1966 DOA: 10/28/2021 DOS: the patient was seen and examined on 10/29/2021 PCP: Etta Grandchild, MD  Patient coming from: Home - lives with wife and 2 children; NOK: Wife, Tyler Cantrell, [phone]   Chief Complaint: Fever, abdominal pain  HPI: Tyler Cantrell is a 56 y.o. male with medical history significant of recurrent pancreatitis, anxiety, and GERD presenting with fever and abdominal pain.  He reports tha the has had lots of digestive issues since 2014.  Has cholecystectomy with some improvement.  This time, he started with abdominal pain Friday night.  Saturday night, he went to bed feeling badly at 8pm.  He had a subjective fever intermittently Sunday.  They did telehealth and were recommended to go to the ER.  They called Dr. Matthias Hughs (a neighbor), who suggested he come to the ER.  Slight nausea, not bothersome, no vomiting.  He took indigestion meds without improvement.  He has had small sporadic BMs since Saturday night.    ER Course:  [facility] transfer, per Dr. Loney Loh:  56 year old with history of anxiety, GERD, pancreatitis presented with complaints of fever and abdominal pain.  WBC 14.  CT showing acute sigmoid diverticulitis with microperforation.  No signs of peritonitis on exam.  He was given antibiotics.    Review of Systems: As mentioned in the history of present illness. All other systems reviewed and are negative. Past Medical History:  Diagnosis Date   Anxiety    GERD (gastroesophageal reflux disease)    Pancreatitis off and off last 3-4 months   Past Surgical History:  Procedure Laterality Date   APPENDECTOMY     BACK SURGERY     ESOPHAGOGASTRODUODENOSCOPY (EGD) WITH PROPOFOL N/A 11/02/2013   Procedure: ESOPHAGOGASTRODUODENOSCOPY (EGD) WITH PROPOFOL;  Surgeon: Willis Modena, MD;  Location: [facility];  Service: Endoscopy;  Laterality: N/A;   EUS N/A 11/02/2013   Procedure:  ESOPHAGEAL ENDOSCOPIC ULTRASOUND (EUS) RADIAL;  Surgeon: Willis Modena, MD;  Location: [facility];  Service: Endoscopy;  Laterality: N/A;   Social History:  reports that he has never smoked. He has never used smokeless tobacco. He reports that he does not currently use alcohol. He reports that he does not use drugs.  Allergies  Allergen Reactions   Penicillins Hives    Family History  Problem Relation Age of Onset   Breast cancer Mother    Prostate cancer Father    Alcohol abuse Brother     Prior to Admission medications   Medication Sig Start Date End Date Taking? Authorizing Provider  calcium carbonate (TUMS - DOSED IN MG ELEMENTAL CALCIUM) 500 MG chewable tablet Chew 1,000 mg by mouth 3 (three) times daily as needed for indigestion or heartburn.   Yes [provider]  ibuprofen (ADVIL) 200 MG tablet Take 400 mg by mouth every 6 (six) hours as needed for mild pain.   Yes [provider]  esomeprazole (NEXIUM) 20 MG capsule Take 1 capsule (20 mg total) by mouth daily at 12 noon. Patient not taking: Reported on 10/29/2021 12/05/20   Etta Grandchild, MD    Physical Exam: Vitals:   10/29/21 0800 10/29/21 0919 10/29/21 1114 10/29/21 1429  BP: 118/85 125/84 136/87 130/85  Pulse: 61 65 66 73  Resp:  18 18 17   Temp:  (!) 97.4 F (36.3 C) 97.7 F (36.5 C) 98.1 F (36.7 C)  TempSrc:  Oral Oral Oral  SpO2: 99% 96% 95% 93%  Weight:      Height:       General:  Appears calm and comfortable and is in NAD Eyes:   EOMI, normal lids, iris ENT:  grossly normal hearing, lips & tongue, mmm; appropriate dentition Neck:  no LAD, masses or thyromegaly Cardiovascular:  RRR, no m/r/g. No LE edema.  Respiratory:   CTA bilaterally with no wheezes/rales/rhonchi.  Normal respiratory effort. Abdomen:  soft, TTP in the LLQ, ND, NABS Skin:  no rash or induration seen on limited exam Musculoskeletal:  grossly normal tone BUE/BLE, good ROM, no bony abnormality Psychiatric:  grossly  normal mood and affect, speech fluent and appropriate, AOx3 Neurologic:  CN 2-12 grossly intact, moves all extremities in coordinated fashion   Radiological Exams on Admission: Independently reviewed - see discussion in A/P where applicable  CT Abdomen Pelvis W Contrast  Result Date: 10/28/2021 CLINICAL DATA:  Abdominal pain, nonlocalized EXAM: CT ABDOMEN AND PELVIS WITH CONTRAST TECHNIQUE: Multidetector CT imaging of the abdomen and pelvis was performed using the standard protocol following bolus administration of intravenous contrast. RADIATION DOSE REDUCTION: This exam was performed according to the departmental dose-optimization program which includes automated exposure control, adjustment of the mA and/or kV according to patient size and/or use of iterative reconstruction technique. CONTRAST:  OMNIPAQUE IOHEXOL 300 MG/ML  SOLN COMPARISON:  CT abdomen and pelvis dated August 15th 2015 FINDINGS: Lower chest: No acute abnormality. Hepatobiliary: No focal liver abnormality is seen. Status post cholecystectomy. No biliary dilatation. Pancreas: Unremarkable. No pancreatic ductal dilatation or surrounding inflammatory changes. Spleen: Normal in size without focal abnormality. Adrenals/Urinary Tract: Bilateral adrenal glands are unremarkable. Kidneys enhance symmetrically with no evidence of hydronephrosis or nephrolithiasis. Bladder is unremarkable. Stomach/Bowel: Wall thickening of the sigmoid colon with marked adjacent inflammatory change and associated sigmoid diverticulosis. A few small locules of extraluminal gas are seen on series 6, image 85. Normal appearance of the stomach and small bowel. No evidence of obstruction. Vascular/Lymphatic: No significant vascular findings are present. No enlarged abdominal or pelvic lymph nodes. Reproductive: Prostate is unremarkable. Other: No abdominal wall hernia or abnormality. No abdominopelvic ascites. Musculoskeletal: No acute or significant osseous  findings. IMPRESSION: Findings compatible with acute sigmoid diverticulitis. A few adjacent small locules of extraluminal gas are seen, compatible with microperforation. Electronically Signed   By: Allegra Lai M.D.   On: 10/28/2021 18:42    EKG: not done   Labs on Admission: I have personally reviewed the available labs and imaging studies at the time of the admission.  Pertinent labs:    Glucose 122 Bili 2.9; 1.6 on 12/05/20 WBC 14.5 COVID/flu negative UA 30 protein Cologuard negative on 12/27/20   Assessment/Plan * Diverticulitis- (present on admission) -Patient's symptoms are c/w diverticulitis and his CT supports this as a diagnosis -His only SIRS criteria is leukocytosis (14.5) -CT shows microperforation -For now, will give bowel rest, IVF, pain medication with morphine, nausea medication with Zofran, and treat with Zosyn for intraabdominal infection -GI and surgery consults requested  Hyperbilirubinemia- (present on admission) -Patient with h/o ETOH use (abuse?) with prior mildly elevated bilirubin -Now with bili 2.9 -Will repeat tomorrow with CMP -GI is consulting  Alcohol use- (present on admission) -Patient with significant routine ETOH intake and reported h/o pancreatitis - possibly related to ETOH use -Does not appear to need CIWA at this time -Cutting back/cessation should be encouraged    Advance Care Planning:   Code Status: Full Code   Consults: GI/surgery  Family Communication: Wife and daughter were  present throughout evaluation  Severity of Illness: The appropriate patient status for this patient is INPATIENT. Inpatient status is judged to be reasonable and necessary in order to provide the required intensity of service to ensure the patient's safety. The patient's presenting symptoms, physical exam findings, and initial radiographic and laboratory data in the context of their chronic comorbidities is felt to place them at high risk for further  clinical deterioration. Furthermore, it is not anticipated that the patient will be medically stable for discharge from the hospital within 2 midnights of admission.   * I certify that at the point of admission it is my clinical judgment that the patient will require inpatient hospital care spanning beyond 2 midnights from the point of admission due to high intensity of service, high risk for further deterioration and high frequency of surveillance required.*  Author: Jonah BlueJennifer Alvar Malinoski, MD 10/29/2021 5:12 PM  For on call review [email or web address].

## 2021-10-29 NOTE — Assessment & Plan Note (Addendum)
-  Patient with significant routine ETOH intake and reported h/o pancreatitis - possibly related to ETOH use -no sign of withdrawal -Cutting back/cessation should be encouraged

## 2021-10-29 NOTE — Progress Notes (Signed)
Mobility Specialist Progress Note   10/29/21 1215  Mobility  Activity Ambulated with assistance in hallway  Level of Assistance Standby assist, set-up cues, supervision of patient - no hands on  Assistive Device  (IV Pole)  Distance Ambulated (ft) 550 ft  Activity Response Tolerated well  $Mobility charge 1 Mobility   Received pt in bed shortly after receiving meds having little pain(2/10) in abdominals and agreeable. Needing no physical assistance but does have a stiff gait d/t tightness in midsection. Returned back to bed w/ call bell in reach and GI PA present.     Frederico Hamman Mobility Specialist Phone Number [phone]

## 2021-10-30 DIAGNOSIS — K5792 Diverticulitis of intestine, part unspecified, without perforation or abscess without bleeding: Secondary | ICD-10-CM

## 2021-10-30 DIAGNOSIS — K5720 Diverticulitis of large intestine with perforation and abscess without bleeding: Secondary | ICD-10-CM | POA: Diagnosis not present

## 2021-10-30 DIAGNOSIS — R109 Unspecified abdominal pain: Secondary | ICD-10-CM | POA: Diagnosis not present

## 2021-10-30 LAB — CBC
HCT: 42.7 % (ref 39.0–52.0)
Hemoglobin: 14.8 g/dL (ref 13.0–17.0)
MCH: 31.9 pg (ref 26.0–34.0)
MCHC: 34.7 g/dL (ref 30.0–36.0)
MCV: 92 fL (ref 80.0–100.0)
Platelets: 209 10*3/uL (ref 150–400)
RBC: 4.64 MIL/uL (ref 4.22–5.81)
RDW: 12.1 % (ref 11.5–15.5)
WBC: 8.9 10*3/uL (ref 4.0–10.5)
nRBC: 0 % (ref 0.0–0.2)

## 2021-10-30 LAB — BASIC METABOLIC PANEL
Anion gap: 11 (ref 5–15)
BUN: 15 mg/dL (ref 6–20)
CO2: 21 mmol/L — ABNORMAL LOW (ref 22–32)
Calcium: 8.5 mg/dL — ABNORMAL LOW (ref 8.9–10.3)
Chloride: 100 mmol/L (ref 98–111)
Creatinine, Ser: 1.03 mg/dL (ref 0.61–1.24)
GFR, Estimated: >60 mL/min (ref >60)
Glucose, Bld: 87 mg/dL (ref 70–99)
Potassium: 3.8 mmol/L (ref 3.5–5.1)
Sodium: 132 mmol/L — ABNORMAL LOW (ref 135–145)

## 2021-10-30 MED ORDER — METRONIDAZOLE 500 MG PO TABS: 500.0000 mg | ORAL_TABLET | Freq: Two times a day (BID) | ORAL | Status: DC

## 2021-10-30 MED ORDER — OXYCODONE HCL 5 MG PO TABS: 5.0000 mg | ORAL_TABLET | ORAL | 0 refills | Status: DC | PRN

## 2021-10-30 MED ORDER — METRONIDAZOLE 500 MG PO TABS: 500.0000 mg | ORAL_TABLET | Freq: Two times a day (BID) | ORAL | 0 refills | Status: DC

## 2021-10-30 MED ORDER — CIPROFLOXACIN HCL 500 MG PO TABS: 500.0000 mg | ORAL_TABLET | Freq: Two times a day (BID) | ORAL | Status: DC

## 2021-10-30 MED ORDER — CIPROFLOXACIN HCL 500 MG PO TABS: 500.0000 mg | ORAL_TABLET | Freq: Two times a day (BID) | ORAL | 0 refills | Status: DC

## 2021-10-30 NOTE — Progress Notes (Addendum)
°  Progress Note   Patient: Tyler Cantrell [id number] DOB: October 26, 1965 DOA: 10/28/2021     1 DOS: the patient was seen and examined on 10/30/2021     Assessment and Plan * Diverticulitis- (present on admission) -Patient's symptoms are c/w diverticulitis and his CT supports this as a diagnosis -His only SIRS criteria is leukocytosis (14.5) -CT shows microperforation -IV abx changed to PO -GI and surgery consults appreciated -clears started and patient can be d/c'd home this PM or in the AM if no increased pain  Hyperbilirubinemia- (present on admission) -Patient with h/o ETOH use (abuse?) with prior mildly elevated bilirubin CMP as an outpatient- defer to GI further work up   Alcohol use- (present on admission) -Patient with significant routine ETOH intake and reported h/o pancreatitis - possibly related to ETOH use -no sign of withdrawal -Cutting back/cessation should be encouraged     Subjective: tired as he did not get much sleep last night  Objective Vitals:   10/30/21 0423 10/30/21 0720  BP: 128/85 116/66  Pulse: 66 65  Resp: 17 15  Temp: 98.2 F (36.8 C) 97.7 F (36.5 C)  SpO2: 95% 96%     General: Appearance:     Overweight male in no acute distress     Lungs:     respirations unlabored  Heart:    Normal heart rate.    MS:   All extremities are intact.    Neurologic:   Awake, alert, oriented x 3     Data Reviewed: Na slightly low otherwise unremarkable   Home later today or in the AM       Time spent: 15 minutes  Author: Geradine Girt, DO 10/30/2021 2:10 PM  For on call review [email or web address].

## 2021-10-30 NOTE — Discharge Summary (Signed)
Physician Discharge Summary  ROSBEL AMADOR [id number] DOB: 22-Mar-1966 DOA: 10/28/2021  PCP: Janith Lima, MD  Admit date: 10/28/2021 Discharge date: 10/30/2021  Admitted From: home Discharge disposition: home   Recommendations for Outpatient Follow-Up:   Avoid alcohol while on flagyl Will need colonoscopy as an outpatient Liver enzymes at next office visit   Discharge Diagnosis:   Principal Problem:   Diverticulitis Active Problems:   Alcohol use   Hyperbilirubinemia    Discharge Condition: Improved.  Diet recommendation: advance as tolerated  Wound care: None.  Code status: Full.   History of Present Illness:   Tyler Cantrell is a 56 y.o. male with medical history significant of recurrent pancreatitis, anxiety, and GERD presenting with fever and abdominal pain.  He reports tha the has had lots of digestive issues since 2014.  Has cholecystectomy with some improvement.  This time, he started with abdominal pain Friday night.  Saturday night, he went to bed feeling badly at 8pm.  He had a subjective fever intermittently Sunday.  They did telehealth and were recommended to go to the ER.  They called Dr. Cristina Gong (a neighbor), who suggested he come to the ER.  Slight nausea, not bothersome, no vomiting.  He took indigestion meds without improvement.  He has had small sporadic BMs since Saturday night.   Hospital Course by Problem:   * Diverticulitis- (present on admission) -Patient's symptoms are c/w diverticulitis and his CT supports this as a diagnosis -His only SIRS criteria is leukocytosis (14.5) -CT shows microperforation -IV abx changed to PO -GI and surgery consults appreciated -clears started and patient can be d/c'd home this PM per GI/GS   Hyperbilirubinemia- (present on admission) -Patient with h/o ETOH use (abuse?) with prior mildly elevated bilirubin CMP as an outpatient- defer to GI further work up    Alcohol use- (present on  admission) -Patient with significant routine ETOH intake and reported h/o pancreatitis - possibly related to ETOH use -no sign of withdrawal -Cutting back/cessation should be encouraged   Hyponatremia -mild -outpatient follow up   Medical Consultants:   [facility]   Discharge Exam:   Vitals:   10/30/21 0423 10/30/21 0720  BP: 128/85 116/66  Pulse: 66 65  Resp: 17 15  Temp: 98.2 F (36.8 C) 97.7 F (36.5 C)  SpO2: 95% 96%   Vitals:   10/29/21 1429 10/29/21 2018 10/30/21 0423 10/30/21 0720  BP: 130/85 129/79 128/85 116/66  Pulse: 73 65 66 65  Resp: 17 16 17 15   Temp: 98.1 F (36.7 C) 97.9 F (36.6 C) 98.2 F (36.8 C) 97.7 F (36.5 C)  TempSrc: Oral Oral Oral Oral  SpO2: 93% 96% 95% 96%  Weight:      Height:        General exam: Appears calm and comfortable.     The results of significant diagnostics from this hospitalization (including imaging, microbiology, ancillary and laboratory) are listed below for reference.     Procedures and Diagnostic Studies:   CT Abdomen Pelvis W Contrast  Result Date: 10/28/2021 CLINICAL DATA:  Abdominal pain, nonlocalized EXAM: CT ABDOMEN AND PELVIS WITH CONTRAST TECHNIQUE: Multidetector CT imaging of the abdomen and pelvis was performed using the standard protocol following bolus administration of intravenous contrast. RADIATION DOSE REDUCTION: This exam was performed according to the departmental dose-optimization program which includes automated exposure control, adjustment of the mA and/or kV according to patient size and/or use of iterative reconstruction technique. CONTRAST:  11mL OMNIPAQUE  IOHEXOL 300 MG/ML  SOLN COMPARISON:  CT abdomen and pelvis dated August 15th 2015 FINDINGS: Lower chest: No acute abnormality. Hepatobiliary: No focal liver abnormality is seen. Status post cholecystectomy. No biliary dilatation. Pancreas: Unremarkable. No pancreatic ductal dilatation or surrounding inflammatory changes. Spleen: Normal in size  without focal abnormality. Adrenals/Urinary Tract: Bilateral adrenal glands are unremarkable. Kidneys enhance symmetrically with no evidence of hydronephrosis or nephrolithiasis. Bladder is unremarkable. Stomach/Bowel: Wall thickening of the sigmoid colon with marked adjacent inflammatory change and associated sigmoid diverticulosis. A few small locules of extraluminal gas are seen on series 6, image 85. Normal appearance of the stomach and small bowel. No evidence of obstruction. Vascular/Lymphatic: No significant vascular findings are present. No enlarged abdominal or pelvic lymph nodes. Reproductive: Prostate is unremarkable. Other: No abdominal wall hernia or abnormality. No abdominopelvic ascites. Musculoskeletal: No acute or significant osseous findings. IMPRESSION: Findings compatible with acute sigmoid diverticulitis. A few adjacent small locules of extraluminal gas are seen, compatible with microperforation. Electronically Signed   By: Yetta Glassman M.D.   On: 10/28/2021 18:42     Labs:   Basic Metabolic Panel: Recent Labs  Lab 10/28/21 1518 10/30/21 0136  NA 134* 132*  K 4.0 3.8  CL 101 100  CO2 22 21*  GLUCOSE 122* 87  BUN 15 15  CREATININE 0.86 1.03  CALCIUM 9.1 8.5*   GFR Estimated Creatinine Clearance: 101.9 mL/min (by C-G formula based on SCr of 1.03 mg/dL). Liver Function Tests: Recent Labs  Lab 10/28/21 1518  AST 12*  ALT 19  ALKPHOS 40  BILITOT 2.9*  PROT 7.6  ALBUMIN 4.5   Recent Labs  Lab 10/28/21 1518  LIPASE <10*   No results for input(s): AMMONIA in the last 168 hours. Coagulation profile No results for input(s): INR, PROTIME in the last 168 hours.  CBC: Recent Labs  Lab 10/28/21 1518 10/30/21 0136  WBC 14.5* 8.9  HGB 15.7 14.8  HCT 46.7 42.7  MCV 91.4 92.0  PLT 232 209   Cardiac Enzymes: No results for input(s): CKTOTAL, CKMB, CKMBINDEX, TROPONINI in the last 168 hours. BNP: Invalid input(s): POCBNP CBG: No results for input(s):  GLUCAP in the last 168 hours. D-Dimer No results for input(s): DDIMER in the last 72 hours. Hgb A1c No results for input(s): HGBA1C in the last 72 hours. Lipid Profile No results for input(s): CHOL, HDL, LDLCALC, TRIG, CHOLHDL, LDLDIRECT in the last 72 hours. Thyroid function studies No results for input(s): TSH, T4TOTAL, T3FREE, THYROIDAB in the last 72 hours.  Invalid input(s): FREET3 Anemia work up No results for input(s): VITAMINB12, FOLATE, FERRITIN, TIBC, IRON, RETICCTPCT in the last 72 hours. Microbiology Recent Results (from the past 240 hour(s))  Resp Panel by RT-PCR (Flu A&B, Covid) Nasopharyngeal Swab     Status: None   Collection Time: 10/28/21  5:58 PM   Specimen: Nasopharyngeal Swab; Nasopharyngeal(NP) swabs in vial transport medium  Result Value Ref Range Status   SARS Coronavirus 2 by RT PCR NEGATIVE NEGATIVE Final    Comment: (NOTE) SARS-CoV-2 target nucleic acids are NOT DETECTED.  The SARS-CoV-2 RNA is generally detectable in upper respiratory specimens during the acute phase of infection. The lowest concentration of SARS-CoV-2 viral copies this assay can detect is 138 copies/mL. A negative result does not preclude SARS-Cov-2 infection and should not be used as the sole basis for treatment or other patient management decisions. A negative result may occur with  improper specimen collection/handling, submission of specimen other than nasopharyngeal swab, presence of  viral mutation(s) within the areas targeted by this assay, and inadequate number of viral copies(<138 copies/mL). A negative result must be combined with clinical observations, patient history, and epidemiological information. The expected result is Negative.  Fact Sheet for Patients:  EntrepreneurPulse.com.au  Fact Sheet for Healthcare Providers:  IncredibleEmployment.be  This test is no t yet approved or cleared by the [place] FDA and  has been  authorized for detection and/or diagnosis of SARS-CoV-2 by FDA under an Emergency Use Authorization (EUA). This EUA will remain  in effect (meaning this test can be used) for the duration of the COVID-19 declaration under Section 564(b)(1) of the Act, 21 U.S.C.section 360bbb-3(b)(1), unless the authorization is terminated  or revoked sooner.       Influenza A by PCR NEGATIVE NEGATIVE Final   Influenza B by PCR NEGATIVE NEGATIVE Final    Comment: (NOTE) The Xpert Xpress SARS-CoV-2/FLU/RSV plus assay is intended as an aid in the diagnosis of influenza from Nasopharyngeal swab specimens and should not be used as a sole basis for treatment. Nasal washings and aspirates are unacceptable for Xpert Xpress SARS-CoV-2/FLU/RSV testing.  Fact Sheet for Patients: EntrepreneurPulse.com.au  Fact Sheet for Healthcare Providers: IncredibleEmployment.be  This test is not yet approved or cleared by the [place] FDA and has been authorized for detection and/or diagnosis of SARS-CoV-2 by FDA under an Emergency Use Authorization (EUA). This EUA will remain in effect (meaning this test can be used) for the duration of the COVID-19 declaration under Section 564(b)(1) of the Act, 21 U.S.C. section 360bbb-3(b)(1), unless the authorization is terminated or revoked.  Performed at [facility], [address] [place], [place] [address]      Discharge Instructions:   Discharge Instructions     Discharge instructions   Complete by: As directed    Slowly advance diet as tolerated-- would do liquid for today and advance to soft/low residue (no raw veggies-- eat food easy to digest) DO NOT DRINK ALCOHOL WHILE TAKING FLAGYL   Increase activity slowly   Complete by: As directed       Allergies as of 10/30/2021       Reactions   Penicillins Hives        Medication List     STOP taking these medications    ibuprofen 200 MG  tablet Commonly known as: ADVIL       TAKE these medications    calcium carbonate 500 MG chewable tablet Commonly known as: TUMS - dosed in mg elemental calcium Chew 1,000 mg by mouth 3 (three) times daily as needed for indigestion or heartburn.   ciprofloxacin 500 MG tablet Commonly known as: CIPRO Take 1 tablet (500 mg total) by mouth 2 (two) times daily.   metroNIDAZOLE 500 MG tablet Commonly known as: FLAGYL Take 1 tablet (500 mg total) by mouth every 12 (twelve) hours.   oxyCODONE 5 MG immediate release tablet Commonly known as: Oxy IR/ROXICODONE Take 1-2 tablets (5-10 mg total) by mouth every 4 (four) hours as needed for moderate pain.        Follow-up Information     Janith Lima, MD Follow up in 1 week(s).   Specialty: Internal Medicine Contact information: [place] [address] [phone]         Arta Silence, MD Follow up.   Specialty: Gastroenterology Why: for colonoscopy in 6-8 weeks Contact information: [address] [place] [address] [phone]  Time coordinating discharge: 35 min  Signed:  Geradine Girt DO  [facility] 10/30/2021, 5:27 PM

## 2021-10-30 NOTE — Progress Notes (Signed)
Progress Note     Subjective: Feeling significantly better this am. Has already ambulated a lot in hallway. Tolerated sips of clears yesterday. Abdominal pain is almost completely resolved. Has had multiple Bms. No nausea or emesis   Objective: Vital signs in last 24 hours: Temp:  [97.4 F (36.3 C)-98.2 F (36.8 C)] 97.7 F (36.5 C) (01/25 0720) Pulse Rate:  [61-73] 65 (01/25 0720) Resp:  [15-18] 15 (01/25 0720) BP: (116-136)/(66-87) 116/66 (01/25 0720) SpO2:  [93 %-99 %] 96 % (01/25 0720) Last BM Date: 10/27/21  Intake/Output from previous day: 01/24 0701 - 01/25 0700 In: 1300.8 [I.V.:1000.8; IV Piggyback:300] Out: -  Intake/Output this shift: No intake/output data recorded.  PE: General: pleasant, WD, male who is laying in bed in NAD HEENT: head is normocephalic, atraumatic. Mouth is pink and moist Heart: Palpable radial pulses  Lungs: Respiratory effort nonlabored on room air Abd: soft, NT, ND, +BS MSK: all 4 extremities are symmetrical with no cyanosis, clubbing, or edema. Skin: warm and dry  Psych: A&Ox3 with an appropriate affect.    Lab Results:  Recent Labs    10/28/21 1518 10/30/21 0136  WBC 14.5* 8.9  HGB 15.7 14.8  HCT 46.7 42.7  PLT 232 209   BMET Recent Labs    10/28/21 1518 10/30/21 0136  NA 134* 132*  K 4.0 3.8  CL 101 100  CO2 22 21*  GLUCOSE 122* 87  BUN 15 15  CREATININE 0.86 1.03  CALCIUM 9.1 8.5*   PT/INR No results for input(s): LABPROT, INR in the last 72 hours. CMP     Component Value Date/Time   NA 132 (L) 10/30/2021 0136   K 3.8 10/30/2021 0136   CL 100 10/30/2021 0136   CO2 21 (L) 10/30/2021 0136   GLUCOSE 87 10/30/2021 0136   BUN 15 10/30/2021 0136   CREATININE 1.03 10/30/2021 0136   CALCIUM 8.5 (L) 10/30/2021 0136   PROT 7.6 10/28/2021 1518   ALBUMIN 4.5 10/28/2021 1518   AST 12 (L) 10/28/2021 1518   ALT 19 10/28/2021 1518   ALKPHOS 40 10/28/2021 1518   BILITOT 2.9 (H) 10/28/2021 1518   GFRNONAA >60  10/30/2021 0136   Lipase     Component Value Date/Time   LIPASE <10 (L) 10/28/2021 1518       Studies/Results: CT Abdomen Pelvis W Contrast  Result Date: 10/28/2021 CLINICAL DATA:  Abdominal pain, nonlocalized EXAM: CT ABDOMEN AND PELVIS WITH CONTRAST TECHNIQUE: Multidetector CT imaging of the abdomen and pelvis was performed using the standard protocol following bolus administration of intravenous contrast. RADIATION DOSE REDUCTION: This exam was performed according to the departmental dose-optimization program which includes automated exposure control, adjustment of the mA and/or kV according to patient size and/or use of iterative reconstruction technique. CONTRAST:  OMNIPAQUE IOHEXOL 300 MG/ML  SOLN COMPARISON:  CT abdomen and pelvis dated August 15th 2015 FINDINGS: Lower chest: No acute abnormality. Hepatobiliary: No focal liver abnormality is seen. Status post cholecystectomy. No biliary dilatation. Pancreas: Unremarkable. No pancreatic ductal dilatation or surrounding inflammatory changes. Spleen: Normal in size without focal abnormality. Adrenals/Urinary Tract: Bilateral adrenal glands are unremarkable. Kidneys enhance symmetrically with no evidence of hydronephrosis or nephrolithiasis. Bladder is unremarkable. Stomach/Bowel: Wall thickening of the sigmoid colon with marked adjacent inflammatory change and associated sigmoid diverticulosis. A few small locules of extraluminal gas are seen on series 6, image 85. Normal appearance of the stomach and small bowel. No evidence of obstruction. Vascular/Lymphatic: No significant vascular findings are present.  No enlarged abdominal or pelvic lymph nodes. Reproductive: Prostate is unremarkable. Other: No abdominal wall hernia or abnormality. No abdominopelvic ascites. Musculoskeletal: No acute or significant osseous findings. IMPRESSION: Findings compatible with acute sigmoid diverticulitis. A few adjacent small locules of extraluminal gas are  seen, compatible with microperforation. Electronically Signed   By: Allegra Lai M.D.   On: 10/28/2021 18:42    Anti-infectives: Anti-infectives (From admission, onward)    Start     Dose/Rate Route Frequency Ordered Stop   10/29/21 1200  cefTRIAXone (ROCEPHIN) 2 g in sodium chloride 0.9 % 100 mL IVPB       See Hyperspace for full Linked Orders Report.   2 g 200 mL/hr over 30 Minutes Intravenous Every 24 hours 10/29/21 1111 11/05/21 1159   10/29/21 1200  metroNIDAZOLE (FLAGYL) IVPB 500 mg       See Hyperspace for full Linked Orders Report.   500 mg 100 mL/hr over 60 Minutes Intravenous Every 12 hours 10/29/21 1111 11/05/21 1159   10/28/21 1915  ciprofloxacin (CIPRO) IVPB 400 mg        400 mg 200 mL/hr over 60 Minutes Intravenous  Once 10/28/21 1912 10/28/21 2100   10/28/21 1915  metroNIDAZOLE (FLAGYL) IVPB 500 mg        500 mg 100 mL/hr over 60 Minutes Intravenous  Once 10/28/21 1912 10/28/21 2200        Assessment/Plan  Sigmoid diverticulitis with microperforation - No current indication for emergency surgery - Continue IV antibiotics - afebrile, WBC 8.9 (14.5) - abdominal pain significantly improved - GI also following and agree with clear liquids  - If patient improves with conservative therapies recommend colonoscopy in ~6 weeks - GI has seen here and planning outpatient follow up   FEN - clear liquid diet VTE - SCDs, okay for chemical prophylaxis from a general surgery standpoint ID - Rocephin/Flagyl - plan for 14 days abx therapy total Foley - None    I reviewed Consultant (gastroenterology) notes, last 24 h vitals and pain scores, last 48 h intake and output, and last 24 h labs and trends.  This care required moderate level of medical decision making.    LOS: 1 day   Eric Form, [facility] Surgery 10/30/2021, 7:41 AM Please see Amion for pager number during day hours 7:00am-4:30pm

## 2021-10-30 NOTE — Progress Notes (Signed)
Mobility Specialist Progress Note   10/30/21 1045  Mobility  Activity Refused mobility   Pt stating to have walked 5 laps this morning and doesn't need any assistance.   Frederico Hamman Mobility Specialist Phone Number [phone]

## 2021-10-30 NOTE — Progress Notes (Signed)
Subjective: Patient reports being pain-free currently. Has not required pain medication since 8 PM last night, otherwise yesterday was on pain medication every 4 hours. He has been able to ambulate without assistance. He had a bowel movement today morning and yesterday in the evening. Denies nausea or vomiting.  Objective: Vital signs in last 24 hours: Temp:  [97.4 F (36.3 C)-98.2 F (36.8 C)] 97.7 F (36.5 C) (01/25 0720) Pulse Rate:  [65-73] 65 (01/25 0720) Resp:  [15-18] 15 (01/25 0720) BP: (116-136)/(66-87) 116/66 (01/25 0720) SpO2:  [93 %-96 %] 96 % (01/25 0720) Weight change:  Last BM Date: 10/27/21  PE: No pallor, no icterus GENERAL: Moist mucous membranes, no signs of dehydration  ABDOMEN: Soft, mild left lower quadrant tenderness, normoactive bowel sounds EXTREMITIES: No deformity  Lab Results: Results for orders placed or performed during the hospital encounter of 10/28/21 (from the past 48 hour(s))  Lipase, blood     Status: Abnormal   Collection Time: 10/28/21  3:18 PM  Result Value Ref Range   Lipase <10 (L) 11 - 51 U/L    Comment: Performed at [facility], [address], [place], [place] [address]  Comprehensive metabolic panel     Status: Abnormal   Collection Time: 10/28/21  3:18 PM  Result Value Ref Range   Sodium 134 (L) 135 - 145 mmol/L   Potassium 4.0 3.5 - 5.1 mmol/L   Chloride 101 98 - 111 mmol/L   CO2 22 22 - 32 mmol/L   Glucose, Bld 122 (H) 70 - 99 mg/dL    Comment: Glucose reference range applies only to samples taken after fasting for at least 8 hours.   BUN 15 6 - 20 mg/dL   Creatinine, Ser 3.00 0.61 - 1.24 mg/dL   Calcium 9.1 8.9 - 76.2 mg/dL   Total Protein 7.6 6.5 - 8.1 g/dL   Albumin 4.5 3.5 - 5.0 g/dL   AST 12 (L) 15 - 41 U/L   ALT 19 0 - 44 U/L   Alkaline Phosphatase 40 38 - 126 U/L   Total Bilirubin 2.9 (H) 0.3 - 1.2 mg/dL   GFR, Estimated >26 >33 mL/min    Comment: (NOTE) Calculated using the CKD-EPI  Creatinine Equation (2021)    Anion gap 11 5 - 15    Comment: Performed at [facility], [address], [place], [place] [address]  CBC     Status: Abnormal   Collection Time: 10/28/21  3:18 PM  Result Value Ref Range   WBC 14.5 (H) 4.0 - 10.5 K/uL   RBC 5.11 4.22 - 5.81 MIL/uL   Hemoglobin 15.7 13.0 - 17.0 g/dL   HCT 25.6 38.9 - 37.3 %   MCV 91.4 80.0 - 100.0 fL   MCH 30.7 26.0 - 34.0 pg   MCHC 33.6 30.0 - 36.0 g/dL   RDW 42.8 76.8 - 11.5 %   Platelets 232 150 - 400 K/uL   nRBC 0.0 0.0 - 0.2 %    Comment: Performed at [facility], [address] [place], [place] [address]  Urinalysis, Routine w reflex microscopic Urine, Unspecified Source     Status: Abnormal   Collection Time: 10/28/21  3:18 PM  Result Value Ref Range   Color, Urine YELLOW YELLOW   APPearance CLEAR CLEAR   Specific Gravity, Urine 1.028 1.005 - 1.030   pH 5.5 5.0 - 8.0   Glucose, UA NEGATIVE NEGATIVE mg/dL   Hgb urine dipstick NEGATIVE NEGATIVE   Bilirubin Urine  NEGATIVE NEGATIVE   Ketones, ur NEGATIVE NEGATIVE mg/dL   Protein, ur 30 (A) NEGATIVE mg/dL   Nitrite NEGATIVE NEGATIVE   Leukocytes,Ua NEGATIVE NEGATIVE   RBC / HPF 0-5 0 - 5 RBC/hpf   WBC, UA 0-5 0 - 5 WBC/hpf   Mucus PRESENT     Comment: Performed at [facility], [address], [place], [place] [address]  Resp Panel by RT-PCR (Flu A&B, Covid) Nasopharyngeal Swab     Status: None   Collection Time: 10/28/21  5:58 PM   Specimen: Nasopharyngeal Swab; Nasopharyngeal(NP) swabs in vial transport medium  Result Value Ref Range   SARS Coronavirus 2 by RT PCR NEGATIVE NEGATIVE    Comment: (NOTE) SARS-CoV-2 target nucleic acids are NOT DETECTED.  The SARS-CoV-2 RNA is generally detectable in upper respiratory specimens during the acute phase of infection. The lowest concentration of SARS-CoV-2 viral copies this assay can detect is 138 copies/mL. A negative result does not preclude  SARS-Cov-2 infection and should not be used as the sole basis for treatment or other patient management decisions. A negative result may occur with  improper specimen collection/handling, submission of specimen other than nasopharyngeal swab, presence of viral mutation(s) within the areas targeted by this assay, and inadequate number of viral copies(<138 copies/mL). A negative result must be combined with clinical observations, patient history, and epidemiological information. The expected result is Negative.  Fact Sheet for Patients:  BloggerCourse.comhttps://www.fda.gov/media/152166/download  Fact Sheet for Healthcare Providers:  SeriousBroker.ithttps://www.fda.gov/media/152162/download  This test is no t yet approved or cleared by the [place] FDA and  has been authorized for detection and/or diagnosis of SARS-CoV-2 by FDA under an Emergency Use Authorization (EUA). This EUA will remain  in effect (meaning this test can be used) for the duration of the COVID-19 declaration under Section 564(b)(1) of the Act, 21 U.S.C.section 360bbb-3(b)(1), unless the authorization is terminated  or revoked sooner.       Influenza A by PCR NEGATIVE NEGATIVE   Influenza B by PCR NEGATIVE NEGATIVE    Comment: (NOTE) The Xpert Xpress SARS-CoV-2/FLU/RSV plus assay is intended as an aid in the diagnosis of influenza from Nasopharyngeal swab specimens and should not be used as a sole basis for treatment. Nasal washings and aspirates are unacceptable for Xpert Xpress SARS-CoV-2/FLU/RSV testing.  Fact Sheet for Patients: BloggerCourse.comhttps://www.fda.gov/media/152166/download  Fact Sheet for Healthcare Providers: SeriousBroker.ithttps://www.fda.gov/media/152162/download  This test is not yet approved or cleared by the [place] FDA and has been authorized for detection and/or diagnosis of SARS-CoV-2 by FDA under an Emergency Use Authorization (EUA). This EUA will remain in effect (meaning this test can be used) for the duration of the COVID-19  declaration under Section 564(b)(1) of the Act, 21 U.S.C. section 360bbb-3(b)(1), unless the authorization is terminated or revoked.  Performed at [facility], [address], [place], [place] [address]   Basic metabolic panel     Status: Abnormal   Collection Time: 10/30/21  1:36 AM  Result Value Ref Range   Sodium 132 (L) 135 - 145 mmol/L   Potassium 3.8 3.5 - 5.1 mmol/L   Chloride 100 98 - 111 mmol/L   CO2 21 (L) 22 - 32 mmol/L   Glucose, Bld 87 70 - 99 mg/dL    Comment: Glucose reference range applies only to samples taken after fasting for at least 8 hours.   BUN 15 6 - 20 mg/dL   Creatinine, Ser 6.571.03 0.61 - 1.24 mg/dL   Calcium 8.5 (L) 8.9 - 10.3 mg/dL  GFR, Estimated >60 >60 mL/min    Comment: (NOTE) Calculated using the CKD-EPI Creatinine Equation (2021)    Anion gap 11 5 - 15    Comment: Performed at [facility], [address], [place], [place] [address]  CBC     Status: None   Collection Time: 10/30/21  1:36 AM  Result Value Ref Range   WBC 8.9 4.0 - 10.5 K/uL   RBC 4.64 4.22 - 5.81 MIL/uL   Hemoglobin 14.8 13.0 - 17.0 g/dL   HCT 79.3 90.3 - 00.9 %   MCV 92.0 80.0 - 100.0 fL   MCH 31.9 26.0 - 34.0 pg   MCHC 34.7 30.0 - 36.0 g/dL   RDW 23.3 00.7 - 62.2 %   Platelets 209 150 - 400 K/uL   nRBC 0.0 0.0 - 0.2 %    Comment: Performed at [facility], [address], [place], [place] [address]    Studies/Results: CT Abdomen Pelvis W Contrast  Result Date: 10/28/2021 CLINICAL DATA:  Abdominal pain, nonlocalized EXAM: CT ABDOMEN AND PELVIS WITH CONTRAST TECHNIQUE: Multidetector CT imaging of the abdomen and pelvis was performed using the standard protocol following bolus administration of intravenous contrast. RADIATION DOSE REDUCTION: This exam was performed according to the departmental dose-optimization program which includes automated exposure control, adjustment of the mA and/or kV according to patient size and/or use of iterative  reconstruction technique. CONTRAST:  OMNIPAQUE IOHEXOL 300 MG/ML  SOLN COMPARISON:  CT abdomen and pelvis dated August 15th 2015 FINDINGS: Lower chest: No acute abnormality. Hepatobiliary: No focal liver abnormality is seen. Status post cholecystectomy. No biliary dilatation. Pancreas: Unremarkable. No pancreatic ductal dilatation or surrounding inflammatory changes. Spleen: Normal in size without focal abnormality. Adrenals/Urinary Tract: Bilateral adrenal glands are unremarkable. Kidneys enhance symmetrically with no evidence of hydronephrosis or nephrolithiasis. Bladder is unremarkable. Stomach/Bowel: Wall thickening of the sigmoid colon with marked adjacent inflammatory change and associated sigmoid diverticulosis. A few small locules of extraluminal gas are seen on series 6, image 85. Normal appearance of the stomach and small bowel. No evidence of obstruction. Vascular/Lymphatic: No significant vascular findings are present. No enlarged abdominal or pelvic lymph nodes. Reproductive: Prostate is unremarkable. Other: No abdominal wall hernia or abnormality. No abdominopelvic ascites. Musculoskeletal: No acute or significant osseous findings. IMPRESSION: Findings compatible with acute sigmoid diverticulitis. A few adjacent small locules of extraluminal gas are seen, compatible with microperforation. Electronically Signed   By: Allegra Lai M.D.   On: 10/28/2021 18:42    Medications: I have reviewed the patient's current medications.  Assessment: Acute sigmoid diverticulitis with microperforation Leukocytosis improved from 14.5-8.9 Mild hyponatremia, mild acidosis, normal renal function  Plan: Okay to start on clear liquid diet from GI standpoint Surgical team following Currently on IV ceftriaxone and metronidazole Patient will need outpatient colonoscopy in 6 to 8 weeks, wants this to be performed by Dr. Dulce Sellar who previously did his EGD and EUS in 2015. Will make arrangements for the  same. From GI standpoint recommend discharging patient on oral antibiotics for total of 14 days. Patient will be seen as outpatient. GI will sign off, please recall if needed.  Kerin Salen, MD 10/30/2021, 8:36 AM

## 2021-10-31 ENCOUNTER — Telehealth: Payer: Self-pay

## 2021-10-31 NOTE — Telephone Encounter (Signed)
Transition Care Management Follow-up Telephone Call Date of discharge and from where: [facility] 10-30-21 Dx: diverticulitis How have you been since you were released from the hospital? Feeling ok  Any questions or concerns? No  Items Reviewed: Did the pt receive and understand the discharge instructions provided? Yes  Medications obtained and verified? Yes  Other? No  Any new allergies since your discharge? No  Dietary orders reviewed? Yes Do you have support at home? Yes   Home Care and Equipment/Supplies: Were home health services ordered? no If so, what is the name of the agency? na  Has the agency set up a time to come to the patient's home? no Were any new equipment or medical supplies ordered?  No What is the name of the medical supply agency? na Were you able to get the supplies/equipment? no Do you have any questions related to the use of the equipment or supplies? No  Functional Questionnaire: (I = Independent and D = Dependent) ADLs: I  Bathing/Dressing- I  Meal Prep- I  Eating- I  Maintaining continence- I  Transferring/Ambulation- I  Managing Meds- I  Follow up appointments reviewed:  PCP Hospital f/u appt confirmed? Yes  Scheduled to see Dr Ronnald Ramp on 11-06-21 @ [facility] f/u appt confirmed? No . Are transportation arrangements needed? No  If their condition worsens, is the pt aware to call PCP or go to the Emergency Dept.? Yes Was the patient provided with contact information for the PCP's office or ED? Yes Was to pt encouraged to call back with questions or concerns? Yes

## 2021-11-06 ENCOUNTER — Other Ambulatory Visit: Payer: Self-pay

## 2021-11-06 ENCOUNTER — Encounter: Payer: Self-pay | Admitting: Internal Medicine

## 2021-11-06 ENCOUNTER — Ambulatory Visit (INDEPENDENT_AMBULATORY_CARE_PROVIDER_SITE_OTHER): Payer: 59 | Admitting: Internal Medicine

## 2021-11-06 VITALS — BP 124/82 | HR 72 | Temp 98.2°F | Ht 73.0 in | Wt 212.0 lb

## 2021-11-06 DIAGNOSIS — K5792 Diverticulitis of intestine, part unspecified, without perforation or abscess without bleeding: Secondary | ICD-10-CM

## 2021-11-06 NOTE — Progress Notes (Signed)
Subjective:  Patient ID: Tyler Cantrell, male    DOB: 06/28/1966  Age: 56 y.o. MRN: [id number]  CC: Follow-up  This visit occurred during the SARS-CoV-2 public health emergency.  Safety protocols were in place, including screening questions prior to the visit, additional usage of staff PPE, and extensive cleaning of exam room while observing appropriate contact time as indicated for disinfecting solutions.    HPI Tyler Cantrell presents for f/up -  He was recently admitted for diverticulitis with a small perforation.  He was initially treated with IV antibiotics and then oral antibiotics.  He is feeling well since discharge.  He is tolerating the antibiotics well.  He denies abdominal pain, nausea, vomiting, loss of appetite, fever, chills, or diarrhea.  Outpatient Medications Prior to Visit  Medication Sig Dispense Refill   calcium carbonate (TUMS - DOSED IN MG ELEMENTAL CALCIUM) 500 MG chewable tablet Chew 1,000 mg by mouth 3 (three) times daily as needed for indigestion or heartburn.     ciprofloxacin (CIPRO) 500 MG tablet Take 1 tablet (500 mg total) by mouth 2 (two) times daily. 24 tablet 0   metroNIDAZOLE (FLAGYL) 500 MG tablet Take 1 tablet (500 mg total) by mouth every 12 (twelve) hours. 26 tablet 0   oxyCODONE (OXY IR/ROXICODONE) 5 MG immediate release tablet Take 1-2 tablets (5-10 mg total) by mouth every 4 (four) hours as needed for moderate pain. 10 tablet 0   No facility-administered medications prior to visit.    ROS Review of Systems  Constitutional:  Negative for appetite change, chills and fever.  HENT: Negative.    Eyes: Negative.   Respiratory:  Negative for cough and shortness of breath.   Cardiovascular:  Negative for chest pain, palpitations and leg swelling.  Gastrointestinal:  Negative for abdominal pain, diarrhea, nausea and vomiting.  Endocrine: Negative.   Genitourinary: Negative.  Negative for difficulty urinating and hematuria.  Musculoskeletal:  Negative.   Skin:  Negative for color change and rash.  Neurological: Negative.  Negative for dizziness and weakness.  Hematological:  Negative for adenopathy. Does not bruise/bleed easily.  Psychiatric/Behavioral: Negative.     Objective:  BP 124/82 (BP Location: Left Arm, Patient Position: Sitting, Cuff Size: Large)    Pulse 72    Temp 98.2 F (36.8 C) (Oral)    Ht 6\' 1"  (1.854 m)    Wt 212 lb (96.2 kg)    SpO2 97%    BMI 27.97 kg/m   BP Readings from Last 3 Encounters:  11/06/21 124/82  10/30/21 116/66  12/05/20 124/82    Wt Readings from Last 3 Encounters:  11/06/21 212 lb (96.2 kg)  10/28/21 215 lb (97.5 kg)  12/05/20 216 lb (98 kg)    Physical Exam Vitals reviewed.  Constitutional:      Appearance: Normal appearance. He is not ill-appearing.  HENT:     Mouth/Throat:     Mouth: Mucous membranes are moist.  Eyes:     General: No scleral icterus. Cardiovascular:     Rate and Rhythm: Normal rate and regular rhythm.     Heart sounds: No murmur heard. Pulmonary:     Effort: Pulmonary effort is normal.     Breath sounds: No stridor. No wheezing, rhonchi or rales.  Abdominal:     General: Abdomen is flat. Bowel sounds are normal. There is no distension.     Palpations: Abdomen is soft. There is no mass.     Tenderness: There is no abdominal tenderness. There is  no guarding.     Hernia: No hernia is present.  Musculoskeletal:        General: Normal range of motion.     Cervical back: Neck supple.     Right lower leg: No edema.     Left lower leg: No edema.  Lymphadenopathy:     Cervical: No cervical adenopathy.  Skin:    General: Skin is warm and dry.  Neurological:     General: No focal deficit present.     Mental Status: He is alert.  Psychiatric:        Mood and Affect: Mood normal.        Behavior: Behavior normal.    Lab Results  Component Value Date   WBC 8.9 10/30/2021   HGB 14.8 10/30/2021   HCT 42.7 10/30/2021   PLT 209 10/30/2021   GLUCOSE 87  10/30/2021   CHOL 167 12/05/2020   TRIG 99.0 12/05/2020   HDL 54.60 12/05/2020   LDLCALC 93 12/05/2020   ALT 19 10/28/2021   AST 12 (L) 10/28/2021   NA 132 (L) 10/30/2021   K 3.8 10/30/2021   CL 100 10/30/2021   CREATININE 1.03 10/30/2021   BUN 15 10/30/2021   CO2 21 (L) 10/30/2021   PSA 0.98 12/05/2020   INR 1.0 12/05/2020    CT Abdomen Pelvis W Contrast  Result Date: 10/28/2021 CLINICAL DATA:  Abdominal pain, nonlocalized EXAM: CT ABDOMEN AND PELVIS WITH CONTRAST TECHNIQUE: Multidetector CT imaging of the abdomen and pelvis was performed using the standard protocol following bolus administration of intravenous contrast. RADIATION DOSE REDUCTION: This exam was performed according to the departmental dose-optimization program which includes automated exposure control, adjustment of the mA and/or kV according to patient size and/or use of iterative reconstruction technique. CONTRAST:  162mL OMNIPAQUE IOHEXOL 300 MG/ML  SOLN COMPARISON:  CT abdomen and pelvis dated August 15th 2015 FINDINGS: Lower chest: No acute abnormality. Hepatobiliary: No focal liver abnormality is seen. Status post cholecystectomy. No biliary dilatation. Pancreas: Unremarkable. No pancreatic ductal dilatation or surrounding inflammatory changes. Spleen: Normal in size without focal abnormality. Adrenals/Urinary Tract: Bilateral adrenal glands are unremarkable. Kidneys enhance symmetrically with no evidence of hydronephrosis or nephrolithiasis. Bladder is unremarkable. Stomach/Bowel: Wall thickening of the sigmoid colon with marked adjacent inflammatory change and associated sigmoid diverticulosis. A few small locules of extraluminal gas are seen on series 6, image 85. Normal appearance of the stomach and small bowel. No evidence of obstruction. Vascular/Lymphatic: No significant vascular findings are present. No enlarged abdominal or pelvic lymph nodes. Reproductive: Prostate is unremarkable. Other: No abdominal wall hernia or  abnormality. No abdominopelvic ascites. Musculoskeletal: No acute or significant osseous findings. IMPRESSION: Findings compatible with acute sigmoid diverticulitis. A few adjacent small locules of extraluminal gas are seen, compatible with microperforation. Electronically Signed   By: Yetta Glassman M.D.   On: 10/28/2021 18:42    Assessment & Plan:   Havard was seen today for follow-up.  Diagnoses and all orders for this visit:  Diverticulitis- Marked improvement noted.  He is no longer taking anything for pain.  He agrees to complete the course of Cipro and metronidazole.   I am having Cassell Smiles maintain his calcium carbonate, ciprofloxacin, metroNIDAZOLE, and oxyCODONE.  No orders of the defined types were placed in this encounter.    Follow-up: Return if symptoms worsen or fail to improve.  Scarlette Calico, MD

## 2021-11-08 ENCOUNTER — Encounter: Payer: Self-pay | Admitting: Internal Medicine

## 2021-11-08 NOTE — Patient Instructions (Signed)
Diverticulitis Diverticulitis is infection or inflammation of small pouches (diverticula) in the colon that form due to a condition called diverticulosis. Diverticula can trap stool (feces) and bacteria, causing infection and inflammation. Diverticulitis may cause severe stomach pain and diarrhea. It may lead to tissue damage in the colon that causes bleeding or blockage. The diverticula may also burst (rupture) and cause infected stool to enter other areas of the abdomen. What are the causes? This condition is caused by stool becoming trapped in the diverticula, which allows bacteria to grow in the diverticula. This leads to inflammation and infection. What increases the risk? You are more likely to develop this condition if you have diverticulosis. The risk increases if you: Are overweight or obese. Do not get enough exercise. Drink alcohol. Use tobacco products. Eat a diet that has a lot of red meat such as beef, pork, or lamb. Eat a diet that does not include enough fiber. High-fiber foods include fruits, vegetables, beans, nuts, and whole grains. Are over 40 years of age. What are the signs or symptoms? Symptoms of this condition may include: Pain and tenderness in the abdomen. The pain is normally located on the left side of the abdomen, but it may occur in other areas. Fever and chills. Nausea. Vomiting. Cramping. Bloating. Changes in bowel routines. Blood in your stool. How is this diagnosed? This condition is diagnosed based on: Your medical history. A physical exam. Tests to make sure there is nothing else causing your condition. These tests may include: Blood tests. Urine tests. CT scan of the abdomen. How is this treated? Most cases of this condition are mild and can be treated at home. Treatment may include: Taking over-the-counter pain medicines. Following a clear liquid diet. Taking antibiotic medicines by mouth. Resting. More severe cases may need to be treated  at a hospital. Treatment may include: Not eating or drinking. Taking prescription pain medicine. Receiving antibiotic medicines through an IV. Receiving fluids and nutrition through an IV. Surgery. When your condition is under control, your health care provider may recommend that you have a colonoscopy. This is an exam to look at the entire large intestine. During the exam, a lubricated, bendable tube is inserted into the anus and then passed into the rectum, colon, and other parts of the large intestine. A colonoscopy can show how severe your diverticula are and whether something else may be causing your symptoms. Follow these instructions at home: Medicines Take over-the-counter and prescription medicines only as told by your health care provider. These include fiber supplements, probiotics, and stool softeners. If you were prescribed an antibiotic medicine, take it as told by your health care provider. Do not stop taking the antibiotic even if you start to feel better. Ask your health care provider if the medicine prescribed to you requires you to avoid driving or using machinery. Eating and drinking  Follow a full liquid diet or another diet as directed by your health care provider. After your symptoms improve, your health care provider may tell you to change your diet. He or she may recommend that you eat a diet that contains at least 25 grams (25 g) of fiber daily. Fiber makes it easier to pass stool. Healthy sources of fiber include: Berries. One cup contains 4-8 grams of fiber. Beans or lentils. One-half cup contains 5-8 grams of fiber. Green vegetables. One cup contains 4 grams of fiber. Avoid eating red meat. General instructions Do not use any products that contain nicotine or tobacco, such as cigarettes,   e-cigarettes, and chewing tobacco. If you need help quitting, ask your health care provider. Exercise for at least 30 minutes, 3 times each week. You should exercise hard enough to  raise your heart rate and break a sweat. Keep all follow-up visits as told by your health care provider. This is important. You may need to have a colonoscopy. Contact a health care provider if: Your pain does not improve. Your bowel movements do not return to normal. Get help right away if: Your pain gets worse. Your symptoms do not get better with treatment. Your symptoms suddenly get worse. You have a fever. You vomit more than one time. You have stools that are bloody, black, or tarry. Summary Diverticulitis is infection or inflammation of small pouches (diverticula) in the colon that form due to a condition called diverticulosis. Diverticula can trap stool (feces) and bacteria, causing infection and inflammation. You are at higher risk for this condition if you have diverticulosis and you eat a diet that does not include enough fiber. Most cases of this condition are mild and can be treated at home. More severe cases may need to be treated at a hospital. When your condition is under control, your health care provider may recommend that you have an exam called a colonoscopy. This exam can show how severe your diverticula are and whether something else may be causing your symptoms. Keep all follow-up visits as told by your health care provider. This is important. This information is not intended to replace advice given to you by your health care provider. Make sure you discuss any questions you have with your health care provider. Document Revised: 07/04/2019 Document Reviewed: 07/04/2019 Elsevier Patient Education  2022 [employer]  

## 2021-11-18 NOTE — Patient Instructions (Addendum)
°  Blood work was ordered.     Medications changes include :   start the antibiotics and take as prescribed  Your prescription(s) have been submitted to your pharmacy. Please take as directed and contact our office if you believe you are having problem(s) with the medication(s).   Ct scan ordered.

## 2021-11-18 NOTE — Progress Notes (Signed)
Subjective:    Patient ID: Tyler Cantrell, male    DOB: May 28, 1966, 56 y.o.   MRN: [id number]  This visit occurred during the SARS-CoV-2 public health emergency.  Safety protocols were in place, including screening questions prior to the visit, additional usage of staff PPE, and extensive cleaning of exam room while observing appropriate contact time as indicated for disinfecting solutions.    HPI The patient is here for an acute visit.   ? Diverticulitis flare -    Ct scan 10/28/21:  acute sigmoid diverticulitis with microperforation.  This was his first episode of diverticulitis.  After two days 98% of the pain was gone.  Felt pretty good when he left.  He had IV abx and then 14 days at home.  After 5 days after getting home he ate larger meals - beets, bananas and salmon.  He was drinking ensure.  He had a little flare up and watched what he was eating and it calmed done.  Over the weekend he ate salmon and more beets and probably ate too much-it flared up again.  He switched his diet.  He still has some LLQ tenderness, but it is better.  His insides feel like they are on fire - feels acidic.  He was been drinking only water - no carbonated beverages.    His bowel movements have not returned to normal-today smaller BM normal.  Then loose BM.  He denies any blood in the stool.  He has been eating very bland.   Yesterday and today - little amount of pain.  1-2/10 if you push on it.  He thinks he just ate too much too quickly and that is what caused the symptoms because once he changes his diet things get better.  He was just concerned because he wants this to get better quickly and does not want to have to have surgery.   Medications and allergies reviewed with patient and updated if appropriate.  Patient Active Problem List   Diagnosis Date Noted   Alcohol use 10/29/2021   Hyperbilirubinemia 10/29/2021   Diverticulitis 10/28/2021   Gastroesophageal reflux disease without  esophagitis 12/05/2020   Routine general medical examination at a health care facility 12/05/2020   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   NASH (nonalcoholic steatohepatitis) 12/05/2020    Current Outpatient Medications on File Prior to Visit  Medication Sig Dispense Refill   calcium carbonate (TUMS - DOSED IN MG ELEMENTAL CALCIUM) 500 MG chewable tablet Chew 1,000 mg by mouth 3 (three) times daily as needed for indigestion or heartburn.     oxyCODONE (OXY IR/ROXICODONE) 5 MG immediate release tablet Take 1-2 tablets (5-10 mg total) by mouth every 4 (four) hours as needed for moderate pain. (Patient not taking: Reported on 11/20/2021) 10 tablet 0   No current facility-administered medications on file prior to visit.    Past Medical History:  Diagnosis Date   Anxiety    GERD (gastroesophageal reflux disease)    Pancreatitis off and off last 3-4 months    Past Surgical History:  Procedure Laterality Date   APPENDECTOMY     BACK SURGERY     ESOPHAGOGASTRODUODENOSCOPY (EGD) WITH PROPOFOL N/A 11/02/2013   Procedure: ESOPHAGOGASTRODUODENOSCOPY (EGD) WITH PROPOFOL;  Surgeon: Willis Modena, MD;  Location: [facility];  Service: Endoscopy;  Laterality: N/A;   EUS N/A 11/02/2013   Procedure: ESOPHAGEAL ENDOSCOPIC ULTRASOUND (EUS) RADIAL;  Surgeon: Willis Modena, MD;  Location: [facility];  Service: Endoscopy;  Laterality: N/A;    Social  History   Socioeconomic History   Marital status: Married    Spouse name: Not on file   Number of children: Not on file   Years of education: Not on file   Highest education level: Not on file  Occupational History   Occupation: geologist  Tobacco Use   Smoking status: Never   Smokeless tobacco: Never  Vaping Use   Vaping Use: Never used  Substance and Sexual Activity   Alcohol use: Not Currently    Comment: 1 case per week   Drug use: No   Sexual activity: Yes    Partners: Female  Other Topics Concern   Not on file  Social History Narrative    Not on file   Social Determinants of Health   Financial Resource Strain: Not on file  Food Insecurity: Not on file  Transportation Needs: Not on file  Physical Activity: Not on file  Stress: Not on file  Social Connections: Not on file    Family History  Problem Relation Age of Onset   Breast cancer Mother    Prostate cancer Father    Alcohol abuse Brother     Review of Systems  Constitutional:  Negative for chills and fever.  Gastrointestinal:  Positive for abdominal distention (At times) and abdominal pain (LLQ). Negative for blood in stool, constipation, diarrhea, nausea and vomiting.       No GERD      Objective:   Vitals:   11/20/21 1444  BP: 138/88  Pulse: 64  Temp: 98.4 F (36.9 C)  SpO2: 97%   BP Readings from Last 3 Encounters:  11/20/21 138/88  11/06/21 124/82  10/30/21 116/66   Wt Readings from Last 3 Encounters:  11/20/21 208 lb (94.3 kg)  11/06/21 212 lb (96.2 kg)  10/28/21 215 lb (97.5 kg)   Body mass index is 27.44 kg/m.   Physical Exam Constitutional:      General: He is not in acute distress.    Appearance: Normal appearance. He is not ill-appearing.  HENT:     Head: Normocephalic and atraumatic.  Abdominal:     General: There is no distension.     Palpations: Abdomen is soft. There is no mass.     Tenderness: There is abdominal tenderness (Left lower quadrant). There is no guarding or rebound.  Musculoskeletal:     Right lower leg: No edema.     Left lower leg: No edema.  Skin:    General: Skin is warm and dry.  Neurological:     Mental Status: He is alert.         CT Abdomen Pelvis W Contrast CLINICAL DATA:  Abdominal pain, nonlocalized  EXAM: CT ABDOMEN AND PELVIS WITH CONTRAST  TECHNIQUE: Multidetector CT imaging of the abdomen and pelvis was performed using the standard protocol following bolus administration of intravenous contrast.  RADIATION DOSE REDUCTION: This exam was performed according to  the departmental dose-optimization program which includes automated exposure control, adjustment of the mA and/or kV according to patient size and/or use of iterative reconstruction technique.  CONTRAST:  OMNIPAQUE IOHEXOL 300 MG/ML  SOLN  COMPARISON:  CT abdomen and pelvis dated August 15th 2015  FINDINGS: Lower chest: No acute abnormality.  Hepatobiliary: No focal liver abnormality is seen. Status post cholecystectomy. No biliary dilatation.  Pancreas: Unremarkable. No pancreatic ductal dilatation or surrounding inflammatory changes.  Spleen: Normal in size without focal abnormality.  Adrenals/Urinary Tract: Bilateral adrenal glands are unremarkable. Kidneys enhance symmetrically with no evidence  of hydronephrosis or nephrolithiasis. Bladder is unremarkable.  Stomach/Bowel: Wall thickening of the sigmoid colon with marked adjacent inflammatory change and associated sigmoid diverticulosis. A few small locules of extraluminal gas are seen on series 6, image 85. Normal appearance of the stomach and small bowel. No evidence of obstruction.  Vascular/Lymphatic: No significant vascular findings are present. No enlarged abdominal or pelvic lymph nodes.  Reproductive: Prostate is unremarkable.  Other: No abdominal wall hernia or abnormality. No abdominopelvic ascites.  Musculoskeletal: No acute or significant osseous findings.  IMPRESSION: Findings compatible with acute sigmoid diverticulitis. A few adjacent small locules of extraluminal gas are seen, compatible with microperforation.  Electronically Signed   By: Allegra Lai M.D.   On: 10/28/2021 18:42    Assessment & Plan:    Diverticulitis with microperforation, left lower quadrant pain: Acute Had very recent episode of diverticulitis with microperforation where he was hospitalized for 2 days-this is his first episode of diverticulitis Had IV antibiotics for 2 days and then oral antibiotics and pain  improved, but while taking oral antibiotics he did have a couple of flares which he associates with eating too much or too much of certain foods-with revising diet his symptoms did improve again Over the weekend he had another flare and after decreasing what is eating his symptoms improved again-pain today 1-2/10, but he is barely eating anything I am concerned that he still having pain, especially with having the microperforation Continue bland diet, small portions CBC, CMP today Restart Cipro and Flagyl CT abdomen and pelvis

## 2021-11-20 ENCOUNTER — Encounter: Payer: Self-pay | Admitting: Internal Medicine

## 2021-11-20 ENCOUNTER — Other Ambulatory Visit: Payer: Self-pay

## 2021-11-20 ENCOUNTER — Ambulatory Visit (INDEPENDENT_AMBULATORY_CARE_PROVIDER_SITE_OTHER): Payer: 59 | Admitting: Internal Medicine

## 2021-11-20 VITALS — BP 138/88 | HR 64 | Temp 98.4°F | Ht 73.0 in | Wt 208.0 lb

## 2021-11-20 DIAGNOSIS — K5720 Diverticulitis of large intestine with perforation and abscess without bleeding: Secondary | ICD-10-CM | POA: Diagnosis not present

## 2021-11-20 DIAGNOSIS — R1032 Left lower quadrant pain: Secondary | ICD-10-CM | POA: Diagnosis not present

## 2021-11-20 LAB — CBC WITH DIFFERENTIAL/PLATELET
Basophils Absolute: 0 10*3/uL (ref 0.0–0.1)
Basophils Relative: 0.6 % (ref 0.0–3.0)
Eosinophils Absolute: 0.1 10*3/uL (ref 0.0–0.7)
Eosinophils Relative: 1.9 % (ref 0.0–5.0)
HCT: 46.7 % (ref 39.0–52.0)
Hemoglobin: 15.4 g/dL (ref 13.0–17.0)
Lymphocytes Relative: 25.3 % (ref 12.0–46.0)
Lymphs Abs: 2 10*3/uL (ref 0.7–4.0)
MCHC: 32.9 g/dL (ref 30.0–36.0)
MCV: 91.6 fl (ref 78.0–100.0)
Monocytes Absolute: 0.5 10*3/uL (ref 0.1–1.0)
Monocytes Relative: 6 % (ref 3.0–12.0)
Neutro Abs: 5.2 10*3/uL (ref 1.4–7.7)
Neutrophils Relative %: 66.2 % (ref 43.0–77.0)
Platelets: 285 10*3/uL (ref 150.0–400.0)
RBC: 5.1 Mil/uL (ref 4.22–5.81)
RDW: 12.7 % (ref 11.5–15.5)
WBC: 7.9 10*3/uL (ref 4.0–10.5)

## 2021-11-20 MED ORDER — CIPROFLOXACIN HCL 500 MG PO TABS: 500.0000 mg | ORAL_TABLET | Freq: Two times a day (BID) | ORAL | 0 refills | Status: AC

## 2021-11-20 MED ORDER — METRONIDAZOLE 500 MG PO TABS: 500.0000 mg | ORAL_TABLET | Freq: Three times a day (TID) | ORAL | 0 refills | Status: AC

## 2021-11-21 ENCOUNTER — Encounter: Payer: Self-pay | Admitting: Internal Medicine

## 2021-11-21 LAB — COMPREHENSIVE METABOLIC PANEL
ALT: 34 U/L (ref 0–53)
AST: 21 U/L (ref 0–37)
Albumin: 4.7 g/dL (ref 3.5–5.2)
Alkaline Phosphatase: 42 U/L (ref 39–117)
BUN: 11 mg/dL (ref 6–23)
CO2: 32 mEq/L (ref 19–32)
Calcium: 9.4 mg/dL (ref 8.4–10.5)
Chloride: 103 mEq/L (ref 96–112)
Creatinine, Ser: 1.03 mg/dL (ref 0.40–1.50)
GFR: 82.9 mL/min (ref >60.00)
Glucose, Bld: 88 mg/dL (ref 70–99)
Potassium: 4.5 mEq/L (ref 3.5–5.1)
Sodium: 139 mEq/L (ref 135–145)
Total Bilirubin: 1.3 mg/dL — ABNORMAL HIGH (ref 0.2–1.2)
Total Protein: 7.5 g/dL (ref 6.0–8.3)

## 2021-11-27 ENCOUNTER — Inpatient Hospital Stay: Admission: RE | Admit: 2021-11-27 | Payer: 59 | Source: Ambulatory Visit

## 2021-11-28 ENCOUNTER — Ambulatory Visit (INDEPENDENT_AMBULATORY_CARE_PROVIDER_SITE_OTHER)
Admission: RE | Admit: 2021-11-28 | Discharge: 2021-11-28 | Disposition: A | Discharge status: Home / Self Care | Payer: 59 | Source: Ambulatory Visit | Attending: Internal Medicine | Admitting: Internal Medicine

## 2021-11-28 ENCOUNTER — Other Ambulatory Visit: Payer: Self-pay

## 2021-11-28 DIAGNOSIS — R1032 Left lower quadrant pain: Secondary | ICD-10-CM | POA: Diagnosis not present

## 2021-11-28 DIAGNOSIS — R109 Unspecified abdominal pain: Secondary | ICD-10-CM | POA: Diagnosis not present

## 2021-11-28 DIAGNOSIS — K760 Fatty (change of) liver, not elsewhere classified: Secondary | ICD-10-CM | POA: Diagnosis not present

## 2021-11-28 MED ORDER — IOHEXOL 300 MG/ML  SOLN
100.0000 mL | Freq: Once | INTRAMUSCULAR | Status: AC | PRN
  Administered 2021-11-28: 100 mL via INTRAVENOUS

## 2021-12-06 DIAGNOSIS — K760 Fatty (change of) liver, not elsewhere classified: Secondary | ICD-10-CM | POA: Diagnosis not present

## 2021-12-06 DIAGNOSIS — Z1211 Encounter for screening for malignant neoplasm of colon: Secondary | ICD-10-CM | POA: Diagnosis not present

## 2021-12-06 DIAGNOSIS — K5792 Diverticulitis of intestine, part unspecified, without perforation or abscess without bleeding: Secondary | ICD-10-CM | POA: Diagnosis not present

## 2022-02-27 ENCOUNTER — Encounter: Payer: Self-pay | Admitting: Internal Medicine

## 2022-02-27 ENCOUNTER — Ambulatory Visit (INDEPENDENT_AMBULATORY_CARE_PROVIDER_SITE_OTHER): Payer: 59 | Admitting: Internal Medicine

## 2022-02-27 ENCOUNTER — Ambulatory Visit (INDEPENDENT_AMBULATORY_CARE_PROVIDER_SITE_OTHER): Payer: 59

## 2022-02-27 VITALS — BP 144/88 | HR 66 | Temp 97.6°F | Ht 73.0 in | Wt 208.0 lb

## 2022-02-27 DIAGNOSIS — J45909 Unspecified asthma, uncomplicated: Secondary | ICD-10-CM | POA: Diagnosis not present

## 2022-02-27 DIAGNOSIS — J22 Unspecified acute lower respiratory infection: Secondary | ICD-10-CM

## 2022-02-27 DIAGNOSIS — R058 Other specified cough: Secondary | ICD-10-CM

## 2022-02-27 DIAGNOSIS — J209 Acute bronchitis, unspecified: Secondary | ICD-10-CM | POA: Insufficient documentation

## 2022-02-27 DIAGNOSIS — R059 Cough, unspecified: Secondary | ICD-10-CM | POA: Diagnosis not present

## 2022-02-27 MED ORDER — AZITHROMYCIN 500 MG PO TABS: 500.0000 mg | ORAL_TABLET | Freq: Every day | ORAL | 0 refills | Status: AC

## 2022-02-27 MED ORDER — HYDROCODONE BIT-HOMATROP MBR 5-1.5 MG/5ML PO SOLN: 5.0000 mL | Freq: Three times a day (TID) | ORAL | 0 refills | Status: AC | PRN

## 2022-02-27 MED ORDER — METHYLPREDNISOLONE 4 MG PO TBPK: ORAL_TABLET | ORAL | 0 refills | Status: AC

## 2022-02-27 NOTE — Progress Notes (Signed)
Subjective:  Patient ID: Tyler Cantrell, male    DOB: 08-07-1966  Age: 56 y.o. MRN: [id number]  CC: Cough   HPI Tyler Cantrell presents for f/up -  He complains of a 1 week history of sore throat, low-grade fever, chills, cough productive of yellow phlegm, headache, wheezing, and shortness of breath.  At home testing was negative for COVID and strep.  Outpatient Medications Prior to Visit  Medication Sig Dispense Refill   calcium carbonate (TUMS - DOSED IN MG ELEMENTAL CALCIUM) 500 MG chewable tablet Chew 1,000 mg by mouth 3 (three) times daily as needed for indigestion or heartburn.     oxyCODONE (OXY IR/ROXICODONE) 5 MG immediate release tablet Take 1-2 tablets (5-10 mg total) by mouth every 4 (four) hours as needed for moderate pain. 10 tablet 0   No facility-administered medications prior to visit.    ROS Review of Systems  Constitutional:  Positive for chills and fever. Negative for appetite change and diaphoresis.  HENT: Negative.  Negative for sore throat and trouble swallowing.   Respiratory:  Positive for cough, shortness of breath and wheezing.   Cardiovascular:  Negative for chest pain, palpitations and leg swelling.  Gastrointestinal:  Negative for abdominal pain, constipation, diarrhea, nausea and vomiting.  Genitourinary:  Negative for difficulty urinating and dysuria.  Musculoskeletal: Negative.   Skin: Negative.   Neurological:  Negative for dizziness, weakness and headaches.  Hematological:  Negative for adenopathy. Does not bruise/bleed easily.  Psychiatric/Behavioral: Negative.     Objective:  BP (!) 144/88 (BP Location: Right Arm, Patient Position: Sitting, Cuff Size: Large)   Pulse 66   Temp 97.6 F (36.4 C) (Oral)   Ht 6\' 1"  (1.854 m)   Wt 208 lb (94.3 kg)   SpO2 96%   BMI 27.44 kg/m   BP Readings from Last 3 Encounters:  02/27/22 (!) 144/88  11/20/21 138/88  11/06/21 124/82    Wt Readings from Last 3 Encounters:  02/27/22 208 lb (94.3 kg)   11/20/21 208 lb (94.3 kg)  11/06/21 212 lb (96.2 kg)    Physical Exam Vitals reviewed.  Constitutional:      Appearance: He is not ill-appearing.  HENT:     Right Ear: Hearing, tympanic membrane, ear canal and external ear normal.     Left Ear: Hearing, tympanic membrane, ear canal and external ear normal.     Nose: Nose normal.     Mouth/Throat:     Mouth: Mucous membranes are moist.     Pharynx: Posterior oropharyngeal erythema present. No oropharyngeal exudate.  Eyes:     General: No scleral icterus.    Conjunctiva/sclera: Conjunctivae normal.  Cardiovascular:     Rate and Rhythm: Normal rate and regular rhythm.  Pulmonary:     Effort: Pulmonary effort is normal.     Breath sounds: No stridor. Examination of the right-middle field reveals rhonchi. Examination of the left-middle field reveals rhonchi. Examination of the right-lower field reveals rhonchi. Examination of the left-lower field reveals rhonchi. Rhonchi present. No wheezing or rales.  Abdominal:     General: Abdomen is flat.     Palpations: There is no mass.     Tenderness: There is no abdominal tenderness. There is no guarding.     Hernia: No hernia is present.  Musculoskeletal:        General: Normal range of motion.     Cervical back: Neck supple.     Right lower leg: No edema.  Left lower leg: No edema.  Lymphadenopathy:     Cervical: No cervical adenopathy.  Skin:    General: Skin is dry.     Findings: No rash.  Neurological:     General: No focal deficit present.     Mental Status: He is alert.  Psychiatric:        Mood and Affect: Mood normal.        Behavior: Behavior normal.    Lab Results  Component Value Date   WBC 7.9 11/20/2021   HGB 15.4 11/20/2021   HCT 46.7 11/20/2021   PLT 285.0 11/20/2021   GLUCOSE 88 11/20/2021   CHOL 167 12/05/2020   TRIG 99.0 12/05/2020   HDL 54.60 12/05/2020   LDLCALC 93 12/05/2020   ALT 34 11/20/2021   AST 21 11/20/2021   NA 139 11/20/2021   K 4.5  11/20/2021   CL 103 11/20/2021   CREATININE 1.03 11/20/2021   BUN 11 11/20/2021   CO2 32 11/20/2021   PSA 0.98 12/05/2020   INR 1.0 12/05/2020    CT Abdomen Pelvis W Contrast  Result Date: 11/30/2021 CLINICAL DATA:  Left lower quadrant abdominal pain. Recent diverticulitis with microperforation on 10/28/2021. Completed antibiotics. EXAM: CT ABDOMEN AND PELVIS WITH CONTRAST TECHNIQUE: Multidetector CT imaging of the abdomen and pelvis was performed using the standard protocol following bolus administration of intravenous contrast. RADIATION DOSE REDUCTION: This exam was performed according to the departmental dose-optimization program which includes automated exposure control, adjustment of the mA and/or kV according to patient size and/or use of iterative reconstruction technique. CONTRAST:  100mL OMNIPAQUE IOHEXOL 300 MG/ML  SOLN COMPARISON:  10/28/2021 FINDINGS: Lower chest: Unremarkable. Hepatobiliary: Mild diffuse low density of the liver relative to the spleen. Cholecystectomy clips. Pancreas: Unremarkable. No pancreatic ductal dilatation or surrounding inflammatory changes. Spleen: Normal in size without focal abnormality. Adrenals/Urinary Tract: Adrenal glands are unremarkable. Several tiny left renal cysts. Otherwise, the kidneys are normal, without renal calculi, focal lesion, or hydronephrosis. Bladder is unremarkable. Stomach/Bowel: Large number of sigmoid colon diverticula. Diffuse low density wall thickening involving the majority of the sigmoid colon. This is significantly improved at the location of the previously demonstrated diverticulitis with minimal residual soft tissue stranding in that area. No extraluminal gas or fluid collections are seen today. Surgically absent appendix. Vascular/Lymphatic: No significant vascular findings are present. No enlarged abdominal or pelvic lymph nodes. Reproductive: Prostate is unremarkable. Other: Small umbilical and left inguinal hernias containing  fat. Musculoskeletal: Lumbar and lower thoracic spine degenerative changes. IMPRESSION: 1. Significantly improved changes of sigmoid diverticulitis with mild residual changes. 2. Extensive sigmoid diverticulosis. 3. Mild diffuse hepatic steatosis. Electronically Signed   By: Beckie SaltsSteven  Reid M.D.   On: 11/30/2021 20:37   DG Chest 2 View  Result Date: 02/27/2022 CLINICAL DATA:  A 56 year old male presents for evaluation of productive cough for 1 week. EXAM: CHEST - 2 VIEW COMPARISON:  None Available. FINDINGS: The heart size and mediastinal contours are within normal limits. Both lungs are clear. The visualized skeletal structures are unremarkable. IMPRESSION: No active cardiopulmonary disease. Electronically Signed   By: Donzetta KohutGeoffrey  Wile M.D.   On: 02/27/2022 15:31     Assessment & Plan:   Tyler Cantrell was seen today for cough.  Diagnoses and all orders for this visit:  Cough productive of yellow sputum -     DG Chest 2 View; Future -     HYDROcodone bit-homatropine (HYCODAN) 5-1.5 MG/5ML syrup; Take 5 mLs by mouth every 8 (eight) hours as  needed for up to 8 days for cough.  LRTI (lower respiratory tract infection) -     HYDROcodone bit-homatropine (HYCODAN) 5-1.5 MG/5ML syrup; Take 5 mLs by mouth every 8 (eight) hours as needed for up to 8 days for cough. -     azithromycin (ZITHROMAX) 500 MG tablet; Take 1 tablet (500 mg total) by mouth daily for 3 days.  Bronchitis with asthma, acute -     methylPREDNISolone (MEDROL DOSEPAK) 4 MG TBPK tablet; TAKE AS DIRECTED   I have discontinued Tyler Cantrell's oxyCODONE. I am also having him start on HYDROcodone bit-homatropine, azithromycin, and methylPREDNISolone. Additionally, I am having him maintain his calcium carbonate.  Meds ordered this encounter  Medications   HYDROcodone bit-homatropine (HYCODAN) 5-1.5 MG/5ML syrup    Sig: Take 5 mLs by mouth every 8 (eight) hours as needed for up to 8 days for cough.    Dispense:  120 mL    Refill:  0    azithromycin (ZITHROMAX) 500 MG tablet    Sig: Take 1 tablet (500 mg total) by mouth daily for 3 days.    Dispense:  3 tablet    Refill:  0   methylPREDNISolone (MEDROL DOSEPAK) 4 MG TBPK tablet    Sig: TAKE AS DIRECTED    Dispense:  21 tablet    Refill:  0     Follow-up: Return if symptoms worsen or fail to improve.  Sanda Linger, MD

## 2022-02-27 NOTE — Patient Instructions (Signed)

## 2022-05-27 DIAGNOSIS — D124 Benign neoplasm of descending colon: Secondary | ICD-10-CM | POA: Diagnosis not present

## 2022-05-27 DIAGNOSIS — K5732 Diverticulitis of large intestine without perforation or abscess without bleeding: Secondary | ICD-10-CM | POA: Diagnosis not present

## 2022-05-27 DIAGNOSIS — K5730 Diverticulosis of large intestine without perforation or abscess without bleeding: Secondary | ICD-10-CM | POA: Diagnosis not present

## 2022-05-27 DIAGNOSIS — K648 Other hemorrhoids: Secondary | ICD-10-CM | POA: Diagnosis not present

## 2022-05-27 DIAGNOSIS — D123 Benign neoplasm of transverse colon: Secondary | ICD-10-CM | POA: Diagnosis not present

## 2022-05-29 DIAGNOSIS — D124 Benign neoplasm of descending colon: Secondary | ICD-10-CM | POA: Diagnosis not present

## 2022-05-29 DIAGNOSIS — D123 Benign neoplasm of transverse colon: Secondary | ICD-10-CM | POA: Diagnosis not present

## 2022-07-09 LAB — HM COLONOSCOPY

## 2023-02-06 DIAGNOSIS — N201 Calculus of ureter: Secondary | ICD-10-CM | POA: Diagnosis not present

## 2023-06-02 DIAGNOSIS — R059 Cough, unspecified: Secondary | ICD-10-CM | POA: Diagnosis not present

## 2023-06-02 DIAGNOSIS — Z681 Body mass index (BMI) 19 or less, adult: Secondary | ICD-10-CM | POA: Diagnosis not present

## 2023-06-02 DIAGNOSIS — J0140 Acute pansinusitis, unspecified: Secondary | ICD-10-CM | POA: Diagnosis not present

## 2023-09-09 ENCOUNTER — Ambulatory Visit: Payer: 59 | Admitting: Internal Medicine

## 2023-09-09 ENCOUNTER — Encounter: Payer: Self-pay | Admitting: Internal Medicine

## 2023-09-09 VITALS — BP 132/74 | HR 75 | Temp 98.2°F | Resp 16 | Ht 73.0 in | Wt 217.4 lb

## 2023-09-09 DIAGNOSIS — R1084 Generalized abdominal pain: Secondary | ICD-10-CM | POA: Diagnosis not present

## 2023-09-09 DIAGNOSIS — R0609 Other forms of dyspnea: Secondary | ICD-10-CM

## 2023-09-09 DIAGNOSIS — K219 Gastro-esophageal reflux disease without esophagitis: Secondary | ICD-10-CM

## 2023-09-09 DIAGNOSIS — Z0000 Encounter for general adult medical examination without abnormal findings: Secondary | ICD-10-CM | POA: Diagnosis not present

## 2023-09-09 DIAGNOSIS — R748 Abnormal levels of other serum enzymes: Secondary | ICD-10-CM

## 2023-09-09 DIAGNOSIS — K5792 Diverticulitis of intestine, part unspecified, without perforation or abscess without bleeding: Secondary | ICD-10-CM | POA: Diagnosis not present

## 2023-09-09 DIAGNOSIS — R10814 Left lower quadrant abdominal tenderness: Secondary | ICD-10-CM | POA: Diagnosis not present

## 2023-09-09 DIAGNOSIS — Z0001 Encounter for general adult medical examination with abnormal findings: Secondary | ICD-10-CM

## 2023-09-09 DIAGNOSIS — Z23 Encounter for immunization: Secondary | ICD-10-CM | POA: Diagnosis not present

## 2023-09-09 LAB — AMYLASE: Amylase: 95 U/L (ref 27–131)

## 2023-09-09 LAB — URINALYSIS, ROUTINE W REFLEX MICROSCOPIC
Bilirubin Urine: NEGATIVE
Hgb urine dipstick: NEGATIVE
Ketones, ur: NEGATIVE
Leukocytes,Ua: NEGATIVE
Nitrite: NEGATIVE
RBC / HPF: NONE SEEN (ref 0–?)
Specific Gravity, Urine: <=1.005 — AB (ref 1.000–1.030)
Total Protein, Urine: NEGATIVE
Urine Glucose: NEGATIVE
Urobilinogen, UA: 0.2 (ref 0.0–1.0)
WBC, UA: NONE SEEN (ref 0–?)
pH: 6 (ref 5.0–8.0)

## 2023-09-09 LAB — LIPID PANEL
Cholesterol: 184 mg/dL (ref 0–200)
HDL: 46.5 mg/dL (ref >39.00)
LDL Cholesterol: 111 mg/dL — ABNORMAL HIGH (ref 0–99)
NonHDL: 137.03
Total CHOL/HDL Ratio: 4
Triglycerides: 130 mg/dL (ref 0.0–149.0)
VLDL: 26 mg/dL (ref 0.0–40.0)

## 2023-09-09 LAB — CBC WITH DIFFERENTIAL/PLATELET
Basophils Absolute: 0 10*3/uL (ref 0.0–0.1)
Basophils Relative: 0.6 % (ref 0.0–3.0)
Eosinophils Absolute: 0.2 10*3/uL (ref 0.0–0.7)
Eosinophils Relative: 2.1 % (ref 0.0–5.0)
HCT: 47.8 % (ref 39.0–52.0)
Hemoglobin: 15.8 g/dL (ref 13.0–17.0)
Lymphocytes Relative: 19.3 % (ref 12.0–46.0)
Lymphs Abs: 1.5 10*3/uL (ref 0.7–4.0)
MCHC: 33 g/dL (ref 30.0–36.0)
MCV: 92.9 fL (ref 78.0–100.0)
Monocytes Absolute: 0.4 10*3/uL (ref 0.1–1.0)
Monocytes Relative: 5.6 % (ref 3.0–12.0)
Neutro Abs: 5.7 10*3/uL (ref 1.4–7.7)
Neutrophils Relative %: 72.4 % (ref 43.0–77.0)
Platelets: 364 10*3/uL (ref 150.0–400.0)
RBC: 5.15 Mil/uL (ref 4.22–5.81)
RDW: 13.8 % (ref 11.5–15.5)
WBC: 7.9 10*3/uL (ref 4.0–10.5)

## 2023-09-09 LAB — HEPATIC FUNCTION PANEL
ALT: 38 U/L (ref 0–53)
AST: 24 U/L (ref 0–37)
Albumin: 4.6 g/dL (ref 3.5–5.2)
Alkaline Phosphatase: 50 U/L (ref 39–117)
Bilirubin, Direct: 0.3 mg/dL (ref 0.0–0.3)
Total Bilirubin: 1.9 mg/dL — ABNORMAL HIGH (ref 0.2–1.2)
Total Protein: 8.2 g/dL (ref 6.0–8.3)

## 2023-09-09 LAB — BASIC METABOLIC PANEL
BUN: 15 mg/dL (ref 6–23)
CO2: 27 meq/L (ref 19–32)
Calcium: 9.6 mg/dL (ref 8.4–10.5)
Chloride: 102 meq/L (ref 96–112)
Creatinine, Ser: 1 mg/dL (ref 0.40–1.50)
GFR: 84.81 mL/min (ref >60.00)
Glucose, Bld: 110 mg/dL — ABNORMAL HIGH (ref 70–99)
Potassium: 4.7 meq/L (ref 3.5–5.1)
Sodium: 137 meq/L (ref 135–145)

## 2023-09-09 LAB — PSA: PSA: 2.26 ng/mL (ref 0.10–4.00)

## 2023-09-09 LAB — C-REACTIVE PROTEIN: CRP: <1 mg/dL (ref 0.5–20.0)

## 2023-09-09 LAB — TROPONIN I (HIGH SENSITIVITY): High Sens Troponin I: 2 ng/L (ref 2–17)

## 2023-09-09 LAB — BRAIN NATRIURETIC PEPTIDE: Pro B Natriuretic peptide (BNP): 11 pg/mL (ref 0.0–100.0)

## 2023-09-09 LAB — LIPASE: Lipase: 81 U/L — ABNORMAL HIGH (ref 11.0–59.0)

## 2023-09-09 MED ORDER — SULFAMETHOXAZOLE-TRIMETHOPRIM 800-160 MG PO TABS: 1.0000 | ORAL_TABLET | Freq: Two times a day (BID) | ORAL | 0 refills | Status: AC

## 2023-09-09 NOTE — Patient Instructions (Signed)
Abdominal Pain, Adult  Pain in the abdomen (abdominal pain) can be caused by many things. In most cases, it gets better with no treatment or by being treated at home. But in some cases, it can be serious. Your health care provider will ask questions about your medical history and do a physical exam to try to figure out what is causing your pain. Follow these instructions at home: Medicines Take over-the-counter and prescription medicines only as told by your provider. Do not take medicines that help you poop (laxatives) unless told by your provider. General instructions Watch your condition for any changes. Drink enough fluid to keep your pee (urine) pale yellow. Contact a health care provider if: Your pain changes, gets worse, or lasts longer than expected. You have severe cramping or bloating in your abdomen, or you vomit. Your pain gets worse with meals, after eating, or with certain foods. You are constipated or have diarrhea for more than 2-3 days. You are not hungry, or you lose weight without trying. You have signs of dehydration. These may include: Dark pee, very little pee, or no pee. Cracked lips or dry mouth. Sleepiness or weakness. You have pain when you pee (urinate) or poop. Your abdominal pain wakes you up at night. You have blood in your pee. You have a fever. Get help right away if: You cannot stop vomiting. Your pain is only in one part of the abdomen. Pain on the right side could be caused by appendicitis. You have bloody or black poop (stool), or poop that looks like tar. You have trouble breathing. You have chest pain. These symptoms may be an emergency. Get help right away. Call 911. Do not wait to see if the symptoms will go away. Do not drive yourself to the hospital. This information is not intended to replace advice given to you by your health care provider. Make sure you discuss any questions you have with your health care provider. Document Revised:  07/09/2022 Document Reviewed: 07/09/2022 [employer]vier Patient Education  2024 [employer]  

## 2023-09-09 NOTE — Progress Notes (Signed)
Subjective:  Patient ID: Tyler Cantrell, male    DOB: 09-22-1966  Age: 57 y.o. MRN: [id number]  CC: Annual Exam and Abdominal Pain   HPI KALIK ZECHER presents for a CPX and f/up ---  Discussed the use of AI scribe software for clinical note transcription with the patient, who gave verbal consent to proceed.  History of Present Illness   The patient, with a history of diverticulitis and a recent micro-perforation, presents with abdominal pain and diarrhea for 2 days. He describes the pain as a burning sensation and pressure, with occasional stabbing pain reminiscent of previous diverticulitis episodes. The pain is generalized across the abdomen, with some discomfort in the area of the previous diverticulitis bout. The patient also reports a loss of appetite and has been eating minimally.  The patient's symptoms have been exacerbated by recent holiday dietary changes. He has been managing the pain with old pain medication and denies any associated nausea or vomiting. He has a history of alcohol consumption, mainly light beer, on a social basis, which increased slightly over the recent holiday period due to family visits.  The patient also reports occasional chest pain, which he attributes to gas, and occasional heartburn or indigestion. He experienced shortness of breath recently while exerting himself with holiday preparations, but denies any current breathing difficulties. He has no history of high blood pressure or high cholesterol.  The patient recently completed a course of antibiotics prescribed by a telemedicine provider for a suspected strep throat infection, which presented with a sore throat, headaches, and fever. This illness resolved two weeks ago. He also reports a high level of work-related stress.       Outpatient Medications Prior to Visit  Medication Sig Dispense Refill   calcium carbonate (TUMS - DOSED IN MG ELEMENTAL CALCIUM) 500 MG chewable tablet Chew 1,000 mg by mouth  3 (three) times daily as needed for indigestion or heartburn.     No facility-administered medications prior to visit.    ROS Review of Systems  Constitutional:  Negative for chills, diaphoresis, fatigue, fever and unexpected weight change.  HENT: Negative.  Negative for sore throat, trouble swallowing and voice change.   Eyes: Negative.  Negative for visual disturbance.  Respiratory:  Positive for shortness of breath. Negative for cough, choking, chest tightness, wheezing and stridor.   Cardiovascular:  Positive for chest pain. Negative for palpitations and leg swelling.  Gastrointestinal:  Positive for abdominal pain and diarrhea. Negative for abdominal distention, anal bleeding, blood in stool, constipation, nausea, rectal pain and vomiting.  Endocrine: Negative.   Genitourinary:  Negative for difficulty urinating, dysuria, hematuria, penile swelling, scrotal swelling, testicular pain and urgency.  Musculoskeletal: Negative.   Skin: Negative.  Negative for color change and rash.  Neurological: Negative.  Negative for dizziness, weakness, light-headedness and headaches.  Hematological:  Negative for adenopathy. Does not bruise/bleed easily.  Psychiatric/Behavioral: Negative.      Objective:  BP 132/74 (BP Location: Left Arm, Patient Position: Sitting, Cuff Size: Normal)   Pulse 75   Temp 98.2 F (36.8 C) (Oral)   Resp 16   Ht 6\' 1"  (1.854 m)   Wt 217 lb 6.4 oz (98.6 kg)   SpO2 92%   BMI 28.68 kg/m   BP Readings from Last 3 Encounters:  09/09/23 132/74  02/27/22 (!) 144/88  11/20/21 138/88    Wt Readings from Last 3 Encounters:  09/09/23 217 lb 6.4 oz (98.6 kg)  02/27/22 208 lb (94.3 kg)  11/20/21  208 lb (94.3 kg)    Physical Exam Vitals reviewed.  Constitutional:      General: He is not in acute distress.    Appearance: He is well-developed. He is not toxic-appearing or diaphoretic.  HENT:     Nose: Nose normal.     Mouth/Throat:     Mouth: Mucous membranes are  moist.  Eyes:     General: No scleral icterus.    Conjunctiva/sclera: Conjunctivae normal.  Cardiovascular:     Rate and Rhythm: Normal rate and regular rhythm.     Pulses: Normal pulses.     Heart sounds: No murmur heard.    No friction rub. No gallop.     Comments: EKG- NSR, 71 bpm No LVH, Q waves, or ST/T waves  Pulmonary:     Effort: Pulmonary effort is normal.     Breath sounds: No stridor. No wheezing, rhonchi or rales.  Abdominal:     General: Abdomen is flat. Bowel sounds are normal. There is no distension.     Palpations: There is no mass.     Tenderness: There is abdominal tenderness in the suprapubic area and left lower quadrant. There is no guarding or rebound.     Hernia: No hernia is present. There is no hernia in the left inguinal area or right inguinal area.  Genitourinary:    Pubic Area: No rash.      Penis: Normal and circumcised.      Testes: Normal.     Epididymis:     Right: Normal.     Left: Normal.     Prostate: Enlarged. Not tender and no nodules present.     Rectum: Guaiac result positive. Internal hemorrhoid present. No mass, tenderness, anal fissure or external hemorrhoid. Normal anal tone.  Musculoskeletal:        General: Normal range of motion.     Cervical back: Neck supple.     Right lower leg: No edema.     Left lower leg: No edema.  Lymphadenopathy:     Cervical: No cervical adenopathy.     Lower Body: No right inguinal adenopathy. No left inguinal adenopathy.  Skin:    General: Skin is warm and dry.     Coloration: Skin is not pale.  Neurological:     General: No focal deficit present.     Mental Status: He is alert and oriented to person, place, and time. Mental status is at baseline.  Psychiatric:        Mood and Affect: Mood normal.        Behavior: Behavior normal.        Thought Content: Thought content normal.        Judgment: Judgment normal.     Lab Results  Component Value Date   WBC 7.9 09/09/2023   HGB 15.8 09/09/2023    HCT 47.8 09/09/2023   PLT 364.0 09/09/2023   GLUCOSE 110 (H) 09/09/2023   CHOL 184 09/09/2023   TRIG 130.0 09/09/2023   HDL 46.50 09/09/2023   LDLCALC 111 (H) 09/09/2023   ALT 38 09/09/2023   AST 24 09/09/2023   NA 137 09/09/2023   K 4.7 09/09/2023   CL 102 09/09/2023   CREATININE 1.00 09/09/2023   BUN 15 09/09/2023   CO2 27 09/09/2023   PSA 2.26 09/09/2023   INR 1.0 12/05/2020    CT Abdomen Pelvis W Contrast  Result Date: 11/30/2021 CLINICAL DATA:  Left lower quadrant abdominal pain. Recent diverticulitis with microperforation on 10/28/2021.  Completed antibiotics. EXAM: CT ABDOMEN AND PELVIS WITH CONTRAST TECHNIQUE: Multidetector CT imaging of the abdomen and pelvis was performed using the standard protocol following bolus administration of intravenous contrast. RADIATION DOSE REDUCTION: This exam was performed according to the departmental dose-optimization program which includes automated exposure control, adjustment of the mA and/or kV according to patient size and/or use of iterative reconstruction technique. CONTRAST:  OMNIPAQUE IOHEXOL 300 MG/ML  SOLN COMPARISON:  10/28/2021 FINDINGS: Lower chest: Unremarkable. Hepatobiliary: Mild diffuse low density of the liver relative to the spleen. Cholecystectomy clips. Pancreas: Unremarkable. No pancreatic ductal dilatation or surrounding inflammatory changes. Spleen: Normal in size without focal abnormality. Adrenals/Urinary Tract: Adrenal glands are unremarkable. Several tiny left renal cysts. Otherwise, the kidneys are normal, without renal calculi, focal lesion, or hydronephrosis. Bladder is unremarkable. Stomach/Bowel: Large number of sigmoid colon diverticula. Diffuse low density wall thickening involving the majority of the sigmoid colon. This is significantly improved at the location of the previously demonstrated diverticulitis with minimal residual soft tissue stranding in that area. No extraluminal gas or fluid collections are  seen today. Surgically absent appendix. Vascular/Lymphatic: No significant vascular findings are present. No enlarged abdominal or pelvic lymph nodes. Reproductive: Prostate is unremarkable. Other: Small umbilical and left inguinal hernias containing fat. Musculoskeletal: Lumbar and lower thoracic spine degenerative changes. IMPRESSION: 1. Significantly improved changes of sigmoid diverticulitis with mild residual changes. 2. Extensive sigmoid diverticulosis. 3. Mild diffuse hepatic steatosis. Electronically Signed   By: Beckie Salts M.D.   On: 11/30/2021 20:37    Assessment & Plan:   DOE (dyspnea on exertion)- EKG and labs are reassuring. -     CBC with Differential/Platelet; Future -     Troponin I (High Sensitivity); Future -     Brain natriuretic peptide; Future -     EKG 12-Lead  Generalized abdominal pain -     Lipase; Future -     Basic metabolic panel; Future -     CBC with Differential/Platelet; Future -     Amylase; Future -     Hepatic function panel; Future -     Urinalysis, Routine w reflex microscopic; Future -     C-reactive protein; Future  Gastroesophageal reflux disease without esophagitis -     CBC with Differential/Platelet; Future  Hyperbilirubinemia -     Hepatic function panel; Future  Encounter for general adult medical examination with abnormal findings - Exam completed, labs reviewed, vaccines reviewed and updated, cancer screenings addressed, pt ed material was given.  -     Lipid panel; Future -     PSA; Future  Need for influenza vaccination -     Flu vaccine trivalent PF, 6mos and older(Flulaval,Afluria,Fluarix,Fluzone)  Need for vaccination -     Tdap vaccine greater than or equal to 7yo IM  Elevated lipase- Will evaluate for pancreatitis and abscess. -     CT ABDOMEN PELVIS W CONTRAST; Future  Left lower quadrant abdominal tenderness without rebound tenderness -     CT ABDOMEN PELVIS W CONTRAST; Future  Diverticulitis -      Sulfamethoxazole-Trimethoprim; Take 1 tablet by mouth 2 (two) times daily for 7 days.  Dispense: 14 tablet; Refill: 0     Follow-up: Return in about 3 weeks (around 09/30/2023).  Sanda Linger, MD

## 2023-09-10 ENCOUNTER — Ambulatory Visit
Admission: RE | Admit: 2023-09-10 | Discharge: 2023-09-10 | Disposition: A | Discharge status: Home / Self Care | Payer: 59 | Source: Ambulatory Visit | Attending: Internal Medicine | Admitting: Internal Medicine

## 2023-09-10 ENCOUNTER — Other Ambulatory Visit: Payer: Self-pay | Admitting: Internal Medicine

## 2023-09-10 DIAGNOSIS — R109 Unspecified abdominal pain: Secondary | ICD-10-CM | POA: Diagnosis not present

## 2023-09-10 DIAGNOSIS — K760 Fatty (change of) liver, not elsewhere classified: Secondary | ICD-10-CM | POA: Diagnosis not present

## 2023-09-10 DIAGNOSIS — K5730 Diverticulosis of large intestine without perforation or abscess without bleeding: Secondary | ICD-10-CM | POA: Diagnosis not present

## 2023-09-10 DIAGNOSIS — R748 Abnormal levels of other serum enzymes: Secondary | ICD-10-CM

## 2023-09-10 DIAGNOSIS — R10814 Left lower quadrant abdominal tenderness: Secondary | ICD-10-CM

## 2023-09-10 MED ORDER — IOPAMIDOL (ISOVUE-300) INJECTION 61%
100.0000 mL | Freq: Once | INTRAVENOUS | Status: AC | PRN
  Administered 2023-09-10: 100 mL via INTRAVENOUS

## 2024-04-04 ENCOUNTER — Ambulatory Visit: Payer: Self-pay | Admitting: *Deleted

## 2024-04-04 NOTE — Telephone Encounter (Signed)
 Message from [facility] G sent at 04/04/2024  2:18 PM EDT   Stomach issue.. worsening ( no other details wife could tell me )    Call History  Contact Date/Time Type Contact Phone/Fax By  04/04/2024 02:16 PM EDT Phone (Incoming) Tyler Cantrell (Emergency Contact) [phone] BENNIE) Tyler Cantrell   Reason for Disposition  Abdominal pain is a chronic symptom (recurrent or ongoing AND present > 4 weeks)  Answer Assessment - Initial Assessment Questions 1. LOCATION: Where does it hurt?      Wife answered.    His stomach has been hurting him.   It's hurting worse. He is really bloated and having nausea.    Wife not sure where pain is.  He is not with me. 2. RADIATION: Does the pain shoot anywhere else? (e.g., chest, back)     No 3. ONSET: When did the pain begin? (Minutes, hours or days ago)      He has been dealing with for years.   It cleared up but over the last week it's been really bother him. 4. SUDDEN: Gradual or sudden onset?     Not asked 5. PATTERN Does the pain come and go, or is it constant?    - If it comes and goes: How long does it last? Do you have pain now?     (Note: Comes and goes means the pain is intermittent. It goes away completely between bouts.)    - If constant: Is it getting better, staying the same, or getting worse?      (Note: Constant means the pain never goes away completely; most serious pain is constant and gets worse.)      It comes and goes. 6. SEVERITY: How bad is the pain?  (e.g., Scale 1-10; mild, moderate, or severe)    - MILD (1-3): Doesn't interfere with normal activities, abdomen soft and not tender to touch.     - MODERATE (4-7): Interferes with normal activities or awakens from sleep, abdomen tender to touch.     - SEVERE (8-10): Excruciating pain, doubled over, unable to do any normal activities.       Intermittent. He has a history of diverticulitis   7. RECURRENT SYMPTOM: Have you ever had this type of stomach pain before?  If Yes, ask: When was the last time? and What happened that time?      Yes 8. CAUSE: What do you think is causing the stomach pain?     Maybe diverticulitis  9. RELIEVING/AGGRAVATING FACTORS: What makes it better or worse? (e.g., antacids, bending or twisting motion, bowel movement)     Nothing 10. OTHER SYMPTOMS: Do you have any other symptoms? (e.g., back pain, diarrhea, fever, urination pain, vomiting)       No diarrhea or vomiting but has bloating and nausea  Protocols used: Abdominal Pain - Male-A-AH FYI Only or Action Required?: FYI only for provider.  Patient was last seen in primary care on 09/09/2023 by Joshua Debby CROME, MD. Called Nurse Triage reporting Abdominal Pain. Symptoms began a week ago. Interventions attempted: OTC medications: Gas X for nausea and bloating. Symptoms are: gradually worsening  Has a history of diverticulitis.  Triage Disposition: See PCP Within 2 Weeks  Patient/caregiver understands and will follow disposition?: Yes

## 2024-04-05 ENCOUNTER — Ambulatory Visit: Admitting: Internal Medicine

## 2024-04-05 ENCOUNTER — Encounter: Payer: Self-pay | Admitting: Internal Medicine

## 2024-04-05 VITALS — BP 132/86 | HR 77 | Temp 98.5°F | Resp 16 | Ht 73.0 in | Wt 210.6 lb

## 2024-04-05 DIAGNOSIS — K219 Gastro-esophageal reflux disease without esophagitis: Secondary | ICD-10-CM | POA: Insufficient documentation

## 2024-04-05 DIAGNOSIS — K580 Irritable bowel syndrome with diarrhea: Secondary | ICD-10-CM | POA: Insufficient documentation

## 2024-04-05 DIAGNOSIS — R748 Abnormal levels of other serum enzymes: Secondary | ICD-10-CM

## 2024-04-05 DIAGNOSIS — K760 Fatty (change of) liver, not elsewhere classified: Secondary | ICD-10-CM | POA: Insufficient documentation

## 2024-04-05 DIAGNOSIS — R141 Gas pain: Secondary | ICD-10-CM | POA: Insufficient documentation

## 2024-04-05 DIAGNOSIS — K649 Unspecified hemorrhoids: Secondary | ICD-10-CM | POA: Insufficient documentation

## 2024-04-05 DIAGNOSIS — F411 Generalized anxiety disorder: Secondary | ICD-10-CM | POA: Insufficient documentation

## 2024-04-05 DIAGNOSIS — I10 Essential (primary) hypertension: Secondary | ICD-10-CM | POA: Insufficient documentation

## 2024-04-05 DIAGNOSIS — K5792 Diverticulitis of intestine, part unspecified, without perforation or abscess without bleeding: Secondary | ICD-10-CM

## 2024-04-05 DIAGNOSIS — M545 Low back pain, unspecified: Secondary | ICD-10-CM | POA: Insufficient documentation

## 2024-04-05 DIAGNOSIS — R10814 Left lower quadrant abdominal tenderness: Secondary | ICD-10-CM | POA: Diagnosis not present

## 2024-04-05 DIAGNOSIS — M5430 Sciatica, unspecified side: Secondary | ICD-10-CM | POA: Insufficient documentation

## 2024-04-05 LAB — LIPID PANEL
Cholesterol: 173 mg/dL (ref 0–200)
HDL: 54.9 mg/dL (ref >39.00)
LDL Cholesterol: 99 mg/dL (ref 0–99)
NonHDL: 117.95
Total CHOL/HDL Ratio: 3
Triglycerides: 96 mg/dL (ref 0.0–149.0)
VLDL: 19.2 mg/dL (ref 0.0–40.0)

## 2024-04-05 LAB — URINALYSIS, ROUTINE W REFLEX MICROSCOPIC
Bilirubin Urine: NEGATIVE
Hgb urine dipstick: NEGATIVE
Ketones, ur: NEGATIVE
Leukocytes,Ua: NEGATIVE
Nitrite: NEGATIVE
Specific Gravity, Urine: 1.025 (ref 1.000–1.030)
Total Protein, Urine: NEGATIVE
Urine Glucose: NEGATIVE
Urobilinogen, UA: 0.2 (ref 0.0–1.0)
pH: 6 (ref 5.0–8.0)

## 2024-04-05 LAB — HEPATIC FUNCTION PANEL
ALT: 33 U/L (ref 0–53)
AST: 22 U/L (ref 0–37)
Albumin: 5 g/dL (ref 3.5–5.2)
Alkaline Phosphatase: 51 U/L (ref 39–117)
Bilirubin, Direct: 0.4 mg/dL — ABNORMAL HIGH (ref 0.0–0.3)
Total Bilirubin: 2.2 mg/dL — ABNORMAL HIGH (ref 0.2–1.2)
Total Protein: 7.8 g/dL (ref 6.0–8.3)

## 2024-04-05 LAB — CBC WITH DIFFERENTIAL/PLATELET
Basophils Absolute: 0 10*3/uL (ref 0.0–0.1)
Basophils Relative: 0.6 % (ref 0.0–3.0)
Eosinophils Absolute: 0.1 10*3/uL (ref 0.0–0.7)
Eosinophils Relative: 2.1 % (ref 0.0–5.0)
HCT: 47.7 % (ref 39.0–52.0)
Hemoglobin: 16.1 g/dL (ref 13.0–17.0)
Lymphocytes Relative: 26.2 % (ref 12.0–46.0)
Lymphs Abs: 1.9 10*3/uL (ref 0.7–4.0)
MCHC: 33.8 g/dL (ref 30.0–36.0)
MCV: 91.2 fl (ref 78.0–100.0)
Monocytes Absolute: 0.6 10*3/uL (ref 0.1–1.0)
Monocytes Relative: 7.9 % (ref 3.0–12.0)
Neutro Abs: 4.5 10*3/uL (ref 1.4–7.7)
Neutrophils Relative %: 63.2 % (ref 43.0–77.0)
Platelets: 240 10*3/uL (ref 150.0–400.0)
RBC: 5.24 Mil/uL (ref 4.22–5.81)
RDW: 13.3 % (ref 11.5–15.5)
WBC: 7.1 10*3/uL (ref 4.0–10.5)

## 2024-04-05 LAB — BASIC METABOLIC PANEL WITH GFR
BUN: 13 mg/dL (ref 6–23)
CO2: 30 meq/L (ref 19–32)
Calcium: 9.8 mg/dL (ref 8.4–10.5)
Chloride: 101 meq/L (ref 96–112)
Creatinine, Ser: 1.02 mg/dL (ref 0.40–1.50)
GFR: 82.49 mL/min (ref >60.00)
Glucose, Bld: 103 mg/dL — ABNORMAL HIGH (ref 70–99)
Potassium: 4.5 meq/L (ref 3.5–5.1)
Sodium: 138 meq/L (ref 135–145)

## 2024-04-05 LAB — TSH: TSH: 5.32 u[IU]/mL (ref 0.35–5.50)

## 2024-04-05 MED ORDER — RIFAXIMIN 550 MG PO TABS: 550.0000 mg | ORAL_TABLET | Freq: Three times a day (TID) | ORAL | 0 refills | Status: AC

## 2024-04-05 NOTE — Patient Instructions (Signed)
Irritable Bowel Syndrome, Adult  Irritable bowel syndrome (IBS) is a group of symptoms that affects the organs responsible for digestion (gastrointestinal tract, or GI tract). IBS is not one specific disease. To regulate how the GI tract works, the body sends signals back and forth between the intestines and the brain. If you have IBS, there may be a problem with these signals. As a result, the GI tract does not function normally. The intestines may become more sensitive and overreact to certain things. This may be especially true when you eat certain foods or when you are under stress. There are four main types of IBS. These may be determined based on the consistency of your stool (feces): IBS with mostly (predominance of) diarrhea. IBS with predominance of constipation. IBS with mixed bowel habits. This includes both diarrhea and constipation. IBS unclassified. This includes IBS that cannot be categorized into one of the other three main types. It is important to know which type of IBS you have. Certain treatments are more likely to be helpful for certain types of IBS. What are the causes? The exact cause of IBS is not known. What increases the risk? You may have a higher risk for IBS if you: Are male. Are younger than 40 years. Have a family history of IBS. Have a mental health condition, such as depression, anxiety, or post-traumatic stress disorder. Have had a bacterial infection of your GI tract. What are the signs or symptoms? Symptoms of IBS vary from person to person. The main symptom is abdominal pain or discomfort. Other symptoms usually include one or more of the following: Diarrhea, constipation, or both. Swelling or bloating in the abdomen. Feeling full after eating a small or regular-sized meal. Frequent gas. Mucus in the stool. A feeling of having more stool left after a bowel movement. Symptoms tend to come and go. They may be triggered by stress, mental health  conditions, or certain foods. How is this diagnosed? This condition may be diagnosed based on a physical exam, your medical history, and your symptoms. You may have tests, such as: Blood tests. Stool test. Colonoscopy. This is a procedure in which your GI tract is viewed with a long, thin, flexible tube. How is this treated? There is no cure for IBS, but treatment can help relieve symptoms. Treatment depends on the type of IBS you have, and may include: Changes to your diet, such as: Avoiding foods that cause symptoms. Drinking more water. Following a low-FODMAP (fermentable oligosaccharides, disaccharides, monosaccharides, and polyols) diet for up to 6 weeks, or as told by your health care provider. FODMAPs are sugars that are hard for some people to digest. Eating more fiber. Eating small meals at the same times every day. Medicines. These may include: Fiber supplements, if you have constipation. Medicine to control diarrhea (antidiarrheal medicines). Medicine to help control muscle tightening (spasms) in your GI tract (antispasmodic medicines). Medicines to help with mental health conditions, such as antidepressants. Talk therapy or counseling. Working with a dietitian to help create a food plan that is right for you. Managing your stress. Follow these instructions at home: Eating and drinking  Eat a healthy diet. Eat 5-6 small meals a day. Try to eat meals at about the same times each day. Do not eat large meals. Gradually eat more fiber-rich foods. These include whole grains, fruits, and vegetables. This may be especially helpful if you have IBS with constipation. Eat a diet low in FODMAPs. You may need to avoid foods such as   citrus fruits, cabbage, garlic, and onions. Drink enough fluid to keep your urine pale yellow. Keep a journal of foods that seem to trigger symptoms. Avoid foods and drinks that: Contain added sugar. Make your symptoms worse. These may include dairy  products, caffeinated drinks, and carbonated drinks. Alcohol use Do not drink alcohol if: Your health care provider tells you not to drink. You are pregnant, may be pregnant, or are planning to become pregnant. If you drink alcohol: Limit how much you have to: 0-1 drink a day for women. 0-2 drinks a day for men. Know how much alcohol is in your drink. In the [place] one drink equals one 12 oz bottle of beer (355 mL), one 5 oz glass of wine (148 mL), or one 1 oz glass of hard liquor (44 mL) General instructions Take over-the-counter and prescription medicines only as told by your health care provider. This includes supplements. Get enough exercise. Do at least 150 minutes of moderate-intensity exercise each week. Manage your stress. Getting enough sleep and exercise can help you manage stress. Keep all follow-up visits. This is important. This includes all visits with your health care provider and therapist. Where to find more information International Foundation for Functional Gastrointestinal Disorders: aboutibs.org National Institute of Diabetes and Digestive and Kidney Diseases: niddk.nih.gov Contact a health care provider if: You have constant pain. You lose weight. You have diarrhea that gets worse. You have bleeding from the rectum. You vomit often. You have a fever. Get help right away if: You have severe abdominal pain. You have diarrhea with symptoms of dehydration, such as dizziness or dry mouth. You have bloody or black stools. You have severe abdominal bloating. You have vomiting that does not stop. You have blood in your vomit. Summary Irritable bowel syndrome (IBS) is not one specific disease. It is a group of symptoms that affects digestion. Your intestines may become more sensitive and overreact to certain things. This may be especially true when you eat certain foods or when you are under stress. There is no cure for IBS, but treatment can help relieve  symptoms. This information is not intended to replace advice given to you by your health care provider. Make sure you discuss any questions you have with your health care provider. Document Revised: 09/04/2021 Document Reviewed: 09/04/2021 Elsevier Patient Education  2024 [employer]  

## 2024-04-05 NOTE — Progress Notes (Unsigned)
 Subjective:  Patient ID: Tyler Cantrell, male    DOB: May 10, 1966  Age: 58 y.o. MRN: [id number]  CC: Abdominal Pain (Bloated x 2 weeks ) and Hypertension   HPI Tyler Cantrell presents for f/up ---  Discussed the use of AI scribe software for clinical note transcription with the patient, who gave verbal consent to proceed.  History of Present Illness   Tyler Cantrell is a 58 year old male with diverticulitis who presents with abdominal bloating and irregular bowel movements.  Approximately two and a half to three weeks ago, after consuming a crab boil and spaghetti, he experienced significant abdominal bloating and irregular bowel movements. The bloating causes his entire gut to feel sore, similar to a 'pancreatic attack' he had experienced in the past. He has been on a fast for the past three days, consuming mainly yogurt drinks and protein drinks to allow his system to reset. Nausea occurs when he does not eat, and his bowel movements are irregular, described as 'hit and miss.' He used magnesium  citrate to induce a bowel movement, which provided some relief, but he feels he may need to take more.  Certain foods, particularly those with tomato sauce or fats, seem to trigger his symptoms. He avoids beef as it is difficult for him to digest and has been taking probiotics to aid digestion. He has a history of diverticulitis, which has left him with a persistent sensation in his abdomen. His abdomen is sore and bloated, with gas that does not seem to pass easily.  He consumes alcohol, typically between a twelve pack and a case of light beer per week, and occasionally smokes cigars. He has been experiencing significant stress at work, which he believes may be contributing to his symptoms.  No high blood pressure symptoms such as headache or blurred vision. He denies DOE, chest pain, cough, or wheezing.       Outpatient Medications Prior to Visit  Medication Sig Dispense Refill   calcium  carbonate (TUMS - DOSED IN MG ELEMENTAL CALCIUM) 500 MG chewable tablet Chew 1,000 mg by mouth 3 (three) times daily as needed for indigestion or heartburn.     Ibuprofen (ADVIL PO) Take by mouth as needed.     Multiple Vitamin (MULTIVITAMIN PO) Take by mouth daily.     Probiotic Product (PROBIOTIC PO) Take by mouth daily.     Simethicone (GAS-X PO) Take by mouth as needed.     No facility-administered medications prior to visit.    ROS Review of Systems  Constitutional:  Positive for unexpected weight change (wt loss). Negative for appetite change, chills, diaphoresis, fatigue and fever.  HENT: Negative.  Negative for trouble swallowing.   Eyes: Negative.   Respiratory:  Negative for cough, chest tightness, shortness of breath and wheezing.   Cardiovascular:  Negative for chest pain, palpitations and leg swelling.  Gastrointestinal:  Positive for abdominal pain and diarrhea. Negative for blood in stool, constipation, nausea and vomiting.  Endocrine: Negative.   Genitourinary: Negative.  Negative for difficulty urinating, dysuria, hematuria, penile swelling and scrotal swelling.  Musculoskeletal: Negative.   Skin: Negative.   Neurological:  Negative for dizziness and weakness.  Hematological:  Negative for adenopathy. Does not bruise/bleed easily.  Psychiatric/Behavioral: Negative.      Objective:  BP 132/86 (BP Location: Left Arm, Patient Position: Sitting, Cuff Size: Normal)   Pulse 77   Temp 98.5 F (36.9 C) (Oral)   Resp 16   Ht 6' 1 (1.854 m)  Wt 210 lb 9.6 oz (95.5 kg)   SpO2 96%   BMI 27.79 kg/m   BP Readings from Last 3 Encounters:  04/05/24 132/86  09/09/23 132/74  02/27/22 (!) 144/88    Wt Readings from Last 3 Encounters:  04/05/24 210 lb 9.6 oz (95.5 kg)  09/09/23 217 lb 6.4 oz (98.6 kg)  02/27/22 208 lb (94.3 kg)    Physical Exam Vitals reviewed.  Constitutional:      General: He is not in acute distress.    Appearance: He is not ill-appearing,  toxic-appearing or diaphoretic.  HENT:     Mouth/Throat:     Mouth: Mucous membranes are moist.  Eyes:     General: No scleral icterus.    Conjunctiva/sclera: Conjunctivae normal.  Cardiovascular:     Rate and Rhythm: Normal rate and regular rhythm.     Heart sounds: No murmur heard.    No friction rub. No gallop.     Comments: EKG--- NSR, 72 bpm NS T wave changes No LVH or Q waves Unchanged  Pulmonary:     Effort: Pulmonary effort is normal.     Breath sounds: No stridor. No wheezing, rhonchi or rales.  Abdominal:     General: Abdomen is flat. Bowel sounds are normal. There is no distension.     Palpations: Abdomen is soft. There is no hepatomegaly, splenomegaly or mass.     Tenderness: There is abdominal tenderness in the left lower quadrant. There is no guarding or rebound.     Hernia: No hernia is present.  Musculoskeletal:        General: Normal range of motion.     Cervical back: Neck supple.     Right lower leg: No edema.     Left lower leg: No edema.  Lymphadenopathy:     Cervical: No cervical adenopathy.  Skin:    General: Skin is warm and dry.     Findings: No rash.  Neurological:     General: No focal deficit present.     Mental Status: He is alert. Mental status is at baseline.  Psychiatric:        Mood and Affect: Mood normal.        Behavior: Behavior normal.     Lab Results  Component Value Date   WBC 7.1 04/05/2024   HGB 16.1 04/05/2024   HCT 47.7 04/05/2024   PLT 240.0 04/05/2024   GLUCOSE 103 (H) 04/05/2024   CHOL 173 04/05/2024   TRIG 96.0 04/05/2024   HDL 54.90 04/05/2024   LDLCALC 99 04/05/2024   ALT 33 04/05/2024   AST 22 04/05/2024   NA 138 04/05/2024   K 4.5 04/05/2024   CL 101 04/05/2024   CREATININE 1.02 04/05/2024   BUN 13 04/05/2024   CO2 30 04/05/2024   TSH 5.32 04/05/2024   PSA 2.26 09/09/2023   INR 1.0 12/05/2020    CT ABDOMEN PELVIS W CONTRAST Result Date: 09/10/2023 CLINICAL DATA:  Abdominal pain, acute  nonlocalized. Decreased appetite. History of diverticulitis. EXAM: CT ABDOMEN AND PELVIS WITH CONTRAST TECHNIQUE: Multidetector CT imaging of the abdomen and pelvis was performed using the standard protocol following bolus administration of intravenous contrast. RADIATION DOSE REDUCTION: This exam was performed according to the departmental dose-optimization program which includes automated exposure control, adjustment of the mA and/or kV according to patient size and/or use of iterative reconstruction technique. CONTRAST:  ISOVUE -300 IOPAMIDOL  (ISOVUE -300) INJECTION 61% COMPARISON:  Abdominopelvic CT 11/28/2021 and 10/28/2021. FINDINGS: Lower chest: Clear lung  bases. No significant pleural or pericardial effusion. Hepatobiliary: Low-density again noted throughout the liver consistent with steatosis. No focal lesions are identified. No evidence of biliary dilatation status post cholecystectomy. Pancreas: Unremarkable. No pancreatic ductal dilatation or surrounding inflammatory changes. Spleen: Normal in size without focal abnormality. Adrenals/Urinary Tract: Both adrenal glands appear normal. No evidence of urinary tract calculus, suspicious renal lesion or hydronephrosis. Several tiny low-density renal lesions are again noted bilaterally, consistent with cysts, for which no specific follow-up imaging is recommended. The bladder appears unremarkable for its degree of distention. Stomach/Bowel: No enteric contrast administered. The stomach appears unremarkable for its degree of distention. No small bowel distension, wall thickening or surrounding inflammation. The appendix is surgically absent. There is descending and sigmoid colon diverticulosis with chronic wall thickening. Possible minimal pericolonic inflammatory change, improved from previous study. No evidence of bowel obstruction or perforation. Vascular/Lymphatic: There are no enlarged abdominal or pelvic lymph nodes. Mild aortoiliac atherosclerosis  without evidence of aneurysm. Circumaortic left renal vein. Reproductive: The prostate gland and seminal vesicles appear unremarkable. Other: Stable small umbilical and left inguinal hernias containing fat. No ascites or pneumoperitoneum. Musculoskeletal: No acute or significant osseous findings. Stable chronic degenerative disc disease at L5-S1 with moderate loss of disc height and endplate osteophytes. IMPRESSION: 1. Descending and sigmoid colon diverticulosis with chronic wall thickening, and possible minimal recurrent inflammation surrounding the sigmoid colon, improved from previous CT. No evidence of bowel obstruction, perforation or abscess. 2. No other acute findings. 3. Stable hepatic steatosis. 4.  Aortic Atherosclerosis (ICD10-I70.0). Electronically Signed   By: Elsie Perone M.D.   On: 09/10/2023 15:59    Assessment & Plan:  Irritable bowel syndrome with diarrhea -     rifAXIMin; Take 1 tablet (550 mg total) by mouth 3 (three) times daily.  Dispense: 42 tablet; Refill: 0 -     Tissue Transglutaminase Abs,IgG,IgA  Left lower quadrant abdominal tenderness without rebound tenderness -     Basic metabolic panel with GFR; Future -     CBC with Differential/Platelet; Future -     Lipid panel; Future -     Urinalysis, Routine w reflex microscopic; Future -     Hepatic function panel; Future -     Lipase; Future -     CT ABDOMEN PELVIS W CONTRAST; Future  Hypertension, unspecified type- BP is well controlled. EKG is negative for LVH. -     EKG 12-Lead -     TSH; Future  Elevated lipase -     CT ABDOMEN PELVIS W CONTRAST; Future  Diverticulitis -     Sulfamethoxazole -Trimethoprim ; Take 1 tablet by mouth 2 (two) times daily for 7 days.  Dispense: 14 tablet; Refill: 0     Follow-up: Return in about 6 months (around 10/06/2024).  Debby Molt, MD

## 2024-04-06 ENCOUNTER — Telehealth: Payer: Self-pay

## 2024-04-06 ENCOUNTER — Ambulatory Visit: Payer: Self-pay | Admitting: Internal Medicine

## 2024-04-06 ENCOUNTER — Other Ambulatory Visit (HOSPITAL_COMMUNITY): Payer: Self-pay

## 2024-04-06 LAB — LIPASE: Lipase: 63 U/L — ABNORMAL HIGH (ref 11.0–59.0)

## 2024-04-06 MED ORDER — SULFAMETHOXAZOLE-TRIMETHOPRIM 800-160 MG PO TABS: 1.0000 | ORAL_TABLET | Freq: Two times a day (BID) | ORAL | 0 refills | Status: AC

## 2024-04-06 NOTE — Telephone Encounter (Signed)
 Pharmacy Patient Advocate Encounter   Received notification from CoverMyMeds that prior authorization for Xifaxan 550 is required/requested.   Insurance verification completed.   The patient is insured through [facility] .   Per test claim: PA required; PA submitted to above mentioned insurance via CoverMyMeds Key/confirmation #/EOC [id number] Status is pending

## 2024-04-06 NOTE — Telephone Encounter (Signed)
 Pharmacy Patient Advocate Encounter  Received notification from [facility] that Prior Authorization for Xifaxan 550 has been APPROVED from 04/06/24 to 04/20/24. Ran test claim, Copay is $477.30. This test claim was processed through [facility]- copay amounts may vary at other pharmacies due to pharmacy/plan contracts, or as the patient moves through the different stages of their insurance plan.    PA #/Case ID/Reference #: [id number] See below for co-pay assistance.   Xifaxan is a prescription medication used to treat irritable bowel syndrome with diarrhea (IBS-D) and hepatic encephalopathy (HE). Patients who need assistance with their monthly copays can call 1-866-XIFAXAN [phone])120-2082). Commercially insured patients with coverage for Xifaxan can sign up for the Xifaxan Instant Savings Card to save as much as $1,500 per month. They will receive savings to reduce their copay to $0.

## 2024-04-07 ENCOUNTER — Telehealth: Payer: Self-pay

## 2024-04-07 NOTE — Telephone Encounter (Signed)
 Unable to reach patient. LMTRC

## 2024-04-07 NOTE — Telephone Encounter (Signed)
 Patient has been made aware and gave a verbal understanding. States that he's on another medication now.

## 2024-04-07 NOTE — Telephone Encounter (Signed)
 Copied from CRM [id number]. Topic: General - Other >> Apr 07, 2024  9:04 AM Antwanette L wrote: Reason for CRM: Consuelo from [employer] is calling because they have not received a prior authorization for the patient ct scan. The patient is having the ct scan on 7/7. Please send the prior authorization today by 12 pm. Please contact Consuelo when the authorization is faxed. Consuelo phone number is [phone] and [facility] fax number is [phone]

## 2024-04-11 ENCOUNTER — Ambulatory Visit
Admission: RE | Admit: 2024-04-11 | Discharge: 2024-04-11 | Disposition: A | Discharge status: Home / Self Care | Source: Ambulatory Visit | Attending: Internal Medicine | Admitting: Internal Medicine

## 2024-04-11 DIAGNOSIS — R10814 Left lower quadrant abdominal tenderness: Secondary | ICD-10-CM

## 2024-04-11 DIAGNOSIS — R748 Abnormal levels of other serum enzymes: Secondary | ICD-10-CM

## 2024-04-11 DIAGNOSIS — K5730 Diverticulosis of large intestine without perforation or abscess without bleeding: Secondary | ICD-10-CM | POA: Diagnosis not present

## 2024-04-11 DIAGNOSIS — K760 Fatty (change of) liver, not elsewhere classified: Secondary | ICD-10-CM | POA: Diagnosis not present

## 2024-04-11 MED ORDER — IOPAMIDOL (ISOVUE-300) INJECTION 61%
100.0000 mL | Freq: Once | INTRAVENOUS | Status: AC | PRN
  Administered 2024-04-11: 100 mL via INTRAVENOUS

## 2024-05-19 DIAGNOSIS — M7912 Myalgia of auxiliary muscles, head and neck: Secondary | ICD-10-CM | POA: Diagnosis not present

## 2024-05-19 DIAGNOSIS — M9901 Segmental and somatic dysfunction of cervical region: Secondary | ICD-10-CM | POA: Diagnosis not present

## 2024-05-19 DIAGNOSIS — M9902 Segmental and somatic dysfunction of thoracic region: Secondary | ICD-10-CM | POA: Diagnosis not present

## 2024-05-19 DIAGNOSIS — M9903 Segmental and somatic dysfunction of lumbar region: Secondary | ICD-10-CM | POA: Diagnosis not present

## 2024-05-19 DIAGNOSIS — M25612 Stiffness of left shoulder, not elsewhere classified: Secondary | ICD-10-CM | POA: Diagnosis not present

## 2024-05-20 ENCOUNTER — Other Ambulatory Visit: Payer: Self-pay | Admitting: Chiropractic Medicine

## 2024-05-20 ENCOUNTER — Ambulatory Visit
Admission: RE | Admit: 2024-05-20 | Discharge: 2024-05-20 | Disposition: A | Discharge status: Home / Self Care | Source: Ambulatory Visit | Attending: Chiropractic Medicine | Admitting: Chiropractic Medicine

## 2024-05-20 DIAGNOSIS — M542 Cervicalgia: Secondary | ICD-10-CM

## 2024-05-20 DIAGNOSIS — M546 Pain in thoracic spine: Secondary | ICD-10-CM

## 2024-05-20 DIAGNOSIS — M47814 Spondylosis without myelopathy or radiculopathy, thoracic region: Secondary | ICD-10-CM | POA: Diagnosis not present

## 2024-07-20 ENCOUNTER — Ambulatory Visit: Payer: Self-pay

## 2024-07-20 NOTE — Telephone Encounter (Signed)
 FYI Only or Action Required?: FYI only for provider.  Patient was last seen in primary care on 04/05/2024 by Joshua Debby CROME, MD.  Called Nurse Triage reporting [place].  Symptoms began several weeks ago.  Interventions attempted: Nothing.  Symptoms are: gradually worsening.  Triage Disposition: See PCP When Office is Open (Within 3 Days)  Patient/caregiver understands and will follow disposition?: Yes   Copied from CRM #[id number]. Topic: Clinical - Red Word Triage >> Jul 20, 2024 10:39 AM Harlene ORN wrote: Red Word that prompted transfer to Nurse Triage: lump in groin area causing pain; getting bigger Reason for Disposition  [1] Small swelling or lump AND [2] unexplained AND [3] present > 1 week  Answer Assessment - Initial Assessment Questions 1. APPEARANCE of SWELLING: What does it look like?     Like half a marble and growing 2. SIZE: How large is the swelling? (e.g., inches, cm; or compare to size of pinhead, tip of pen, eraser, coin, pea, grape, ping pong ball)      1/2 inch lump and  growing 3. LOCATION: Where is the swelling located?     Right lower abdomen 4. ONSET: When did the swelling start?     A couple of weeks ago 5. COLOR: What color is it? Is there more than one color?     Skin is becoming red 6. PAIN: Is there any pain? If Yes, ask: How bad is the pain? (Scale 1-10; or mild, moderate, severe)       Very sensitive to touch 7. ITCH: Does it itch? If Yes, ask: How bad is the itch?      Denies itching 8. CAUSE: What do you think caused the swelling?     unsure 9 OTHER SYMPTOMS: Do you have any other symptoms? (e.g., fever)     denies  Protocols used: Skin Lump or Localized Swelling-A-AH

## 2024-07-21 ENCOUNTER — Ambulatory Visit: Admitting: Emergency Medicine

## 2024-07-21 ENCOUNTER — Encounter: Payer: Self-pay | Admitting: Emergency Medicine

## 2024-07-21 VITALS — BP 132/88 | HR 64 | Temp 98.0°F | Ht 73.0 in | Wt 213.0 lb

## 2024-07-21 DIAGNOSIS — D219 Benign neoplasm of connective and other soft tissue, unspecified: Secondary | ICD-10-CM | POA: Diagnosis not present

## 2024-07-21 NOTE — Assessment & Plan Note (Signed)
 Benign appearance.  No red flag signs or symptoms. Differential diagnosis discussed.  No findings of abscess formation No findings of hernia. Recommend soft tissue ultrasound for further definition. Advised to contact the office if clinical picture changes or symptoms get worse.

## 2024-07-21 NOTE — Patient Instructions (Signed)
 Lipoma  A lipoma is a noncancerous (benign) tumor that is made up of fat cells. This is a very common type of soft-tissue growth. Lipomas are usually found under the skin (subcutaneous). They may occur in any tissue of the body that contains fat. Common areas for lipomas to appear include the back, arms, shoulders, buttocks, and thighs. Lipomas grow slowly, and they are usually painless. Most lipomas do not cause problems and do not require treatment. What are the causes? The cause of this condition is not known. What increases the risk? You are more likely to develop this condition if: You are 18-42 years old. You have a family history of lipomas. What are the signs or symptoms? A lipoma usually appears as a small, round bump under the skin. In most cases, the lump will: Feel soft or rubbery. Not cause pain or other symptoms. However, if a lipoma is located in an area where it pushes on nerves, it can become painful or cause other symptoms. How is this diagnosed? A lipoma can usually be diagnosed with a physical exam. You may also have tests to confirm the diagnosis and to rule out other conditions. Tests may include: Imaging tests, such as a CT scan or an MRI. Removal of a tissue sample to be looked at under a microscope (biopsy). How is this treated? Treatment for this condition depends on the size of the lipoma and whether it is causing any symptoms. For small lipomas that are not causing problems, no treatment is needed. If a lipoma is bigger or it causes problems, surgery may be done to remove the lipoma. Lipomas can also be removed to improve appearance. Most often, the procedure is done after applying a medicine that numbs the area (local anesthetic). Liposuction may be done to reduce the size of the lipoma before it is removed through surgery, or it may be done to remove the lipoma. Lipomas are removed with this method to limit incision size and scarring. A liposuction tube is  inserted through a small incision into the lipoma, and the contents of the lipoma are removed through the tube with suction. Follow these instructions at home: Watch your lipoma for any changes. Keep all follow-up visits. This is important. Where to find more information OrthoInfo: orthoinfo.aaos.org Contact a health care provider if: Your lipoma becomes larger or hard. Your lipoma becomes painful, red, or increasingly swollen. These could be signs of infection or a more serious condition. Get help right away if: You develop tingling or numbness in an area near the lipoma. This could indicate that the lipoma is causing nerve damage. Summary A lipoma is a noncancerous tumor that is made up of fat cells. Most lipomas do not cause problems and do not require treatment. If a lipoma is bigger or it causes problems, surgery may be done to remove the lipoma. Contact a health care provider if your lipoma becomes larger or hard, or if it becomes painful, red, or increasingly swollen. These could be signs of infection or a more serious condition. This information is not intended to replace advice given to you by your health care provider. Make sure you discuss any questions you have with your health care provider. Document Revised: 10/11/2021 Document Reviewed: 10/11/2021 Elsevier Patient Education  2024 [employer].

## 2024-07-21 NOTE — Progress Notes (Signed)
 Tyler Cantrell 58 y.o.   Chief Complaint  Patient presents with   Mass    Patient states he has a bump on his lower right abd area. Has been there for about 2-3 weeks, says it is raised, tender when rubbed on.     HISTORY OF PRESENT ILLNESS: This is a 58 y.o. male complaining of soft tissue lump to right lower abdominal area that he noticed about 3 weeks ago Occasionally tender but no other associated symptoms No other complaints or medical concerns today.  HPI   Prior to Admission medications   Medication Sig Start Date End Date Taking? Authorizing Provider  calcium carbonate (TUMS - DOSED IN MG ELEMENTAL CALCIUM) 500 MG chewable tablet Chew 1,000 mg by mouth 3 (three) times daily as needed for indigestion or heartburn.    [provider]  Ibuprofen (ADVIL PO) Take by mouth as needed.    [provider]  Multiple Vitamin (MULTIVITAMIN PO) Take by mouth daily. Patient not taking: Reported on 07/21/2024    [provider]  Probiotic Product (PROBIOTIC PO) Take by mouth daily. Patient not taking: Reported on 07/21/2024    [provider]  rifaximin  (XIFAXAN ) 550 MG TABS tablet Take 1 tablet (550 mg total) by mouth 3 (three) times daily. 04/05/24   Joshua Debby CROME, MD  Simethicone (GAS-X PO) Take by mouth as needed.    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/2024   Left lower quadrant abdominal tenderness without rebound tenderness 04/05/2024   Hypertension 04/05/2024   DOE (dyspnea on exertion) 09/09/2023   Need for influenza vaccination 09/09/2023   Need for vaccination 09/09/2023   Elevated lipase 09/09/2023   Alcohol use 10/29/2021    Hyperbilirubinemia 10/29/2021   Diverticulitis 10/28/2021   Gastroesophageal reflux disease without esophagitis 12/05/2020   Encounter for general adult medical examination with abnormal findings 12/05/2020   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    Past Medical History:  Diagnosis Date   Anxiety    GERD (gastroesophageal reflux disease)    Pancreatitis off and off last 3-4 months    Past Surgical History:  Procedure Laterality Date   APPENDECTOMY     BACK SURGERY     ESOPHAGOGASTRODUODENOSCOPY (EGD) WITH PROPOFOL  N/A 11/02/2013   Procedure: ESOPHAGOGASTRODUODENOSCOPY (EGD) WITH PROPOFOL ;  Surgeon: Elsie Cree, MD;  Location: [facility];  Service: Endoscopy;  Laterality: N/A;   EUS N/A 11/02/2013   Procedure: ESOPHAGEAL ENDOSCOPIC ULTRASOUND (EUS) RADIAL;  Surgeon: Elsie Cree, MD;  Location: [facility];  Service: Endoscopy;  Laterality: N/A;    Social History   Socioeconomic History   Marital status: Married    Spouse name: Not on file   Number of children: Not on file   Years of education: Not on file   Highest education level: Bachelor's degree (e.g., BA, AB, BS)  Occupational History   Occupation: geologist  Tobacco Use   Smoking status: Never   Smokeless tobacco: Never  Vaping Use   Vaping status: Never Used  Substance and Sexual Activity   Alcohol use: Yes    Comment: 1 case per week   Drug use: No   Sexual activity: Yes    Partners: Female  Other Topics Concern   Not on file  Social History Narrative   Not on file   Social Drivers of Health   Financial Resource Strain: Medium Risk (09/08/2023)   Overall Financial Resource Strain (CARDIA)    Difficulty of Paying Living Expenses: Somewhat hard  Food Insecurity: No Food Insecurity (09/08/2023)   Hunger Vital Sign    Worried About Running Out of Food in the Last Year: Never true    Ran Out of Food in the Last Year: Never true  Transportation Needs: Unmet Transportation Needs (09/08/2023)   PRAPARE -  Administrator, Civil Service (Medical): Yes    Lack of Transportation (Non-Medical): Yes  Physical Activity: Insufficiently Active (09/08/2023)   Exercise Vital Sign    Days of Exercise per Week: 2 days    Minutes of Exercise per Session: 20 min  Stress: Stress Concern Present (09/08/2023)   [employer] of Occupational Health - Occupational Stress Questionnaire    Feeling of Stress : Rather much  Social Connections: Moderately Integrated (09/08/2023)   Social Connection and Isolation Panel    Frequency of Communication with Friends and Family: More than three times a week    Frequency of Social Gatherings with Friends and Family: More than three times a week    Attends Religious Services: 1 to 4 times per year    Active Member of [employer] or Organizations: No    Attends Engineer, structural: Not on file    Marital Status: Married  Catering manager Violence: Not on file    Family History  Problem Relation Age of Onset   Breast cancer Mother    Prostate cancer Father    Alcohol abuse Brother      Review of Systems  Constitutional: Negative.  Negative for chills and fever.  HENT: Negative.  Negative for congestion and sore throat.   Respiratory: Negative.  Negative for cough and shortness of breath.   Cardiovascular: Negative.  Negative for chest pain and palpitations.  Gastrointestinal:  Negative for abdominal pain, nausea and vomiting.  Genitourinary: Negative.  Negative for dysuria and hematuria.  Skin: Negative.  Negative for rash.  Neurological: Negative.  Negative for dizziness and headaches.  All other systems reviewed and are negative.   Today's Vitals   07/21/24 1056  BP: 132/88  Pulse: 64  Temp: 98 F (36.7 C)  TempSrc: Oral  SpO2: 98%  Weight: 213 lb (96.6 kg)  Height: 6' 1 (1.854 m)   Body mass index is 28.1 kg/m.   Physical Exam Vitals reviewed.  Constitutional:      Appearance: Normal appearance.  HENT:     Head:  Normocephalic.  Eyes:     Extraocular Movements: Extraocular movements intact.  Cardiovascular:     Rate and Rhythm: Normal rate.  Pulmonary:     Effort: Pulmonary effort is normal.  Abdominal:     Palpations: Abdomen is soft.     Tenderness: There is no abdominal tenderness.     Comments: Old surgical scar from appendectomy  Skin:    General: Skin is warm and dry.     Comments: Small, hard, nontender, erythematous lump to right lower abdomen/right pelvic area No fluctuation.  Neurological:     Mental Status: He is alert and oriented to person, place, and time.  Psychiatric:        Mood and Affect: Mood normal.        Behavior: Behavior normal.      ASSESSMENT & PLAN:  I personally spent a total of 30 minutes minutes in the care of the patient today including preparing to see the patient, getting/reviewing separately obtained history, performing a medically appropriate exam/evaluation, counseling and educating, placing orders, documenting clinical information in the EHR, and coordinating care.  Problem List Items Addressed This Visit       Other   Fibroma - Primary   Benign appearance.  No red flag signs or symptoms. Differential diagnosis discussed.  No findings of abscess formation No findings of hernia. Recommend soft tissue ultrasound for further definition. Advised to contact the office if clinical picture changes or symptoms get worse.      Relevant Orders   US  SOFT TISSUE LOWER EXTREMITY LIMITED RIGHT (NON-VASCULAR)   Patient Instructions  Lipoma  A lipoma is a noncancerous (benign) tumor that is made up of fat cells. This is a very common type of soft-tissue growth. Lipomas are usually found under the skin (subcutaneous). They may occur in any tissue of the body that contains fat. Common areas for lipomas to appear include the back, arms, shoulders, buttocks, and thighs. Lipomas grow slowly, and they are usually painless. Most lipomas do not cause problems and do  not require treatment. What are the causes? The cause of this condition is not known. What increases the risk? You are more likely to develop this condition if: You are 14-68 years old. You have a family history of lipomas. What are the signs or symptoms? A lipoma usually appears as a small, round bump under the skin. In most cases, the lump will: Feel soft or rubbery. Not cause pain or other symptoms. However, if a lipoma is located in an area where it pushes on nerves, it can become painful or cause other symptoms. How is this diagnosed? A lipoma can usually be diagnosed with a physical exam. You may also have tests to confirm the diagnosis and to rule out other conditions. Tests may include: Imaging tests, such as a CT scan or an MRI. Removal of a tissue sample to be looked at under a microscope (biopsy). How is this treated? Treatment for this condition depends on the size of the lipoma and whether it is causing any symptoms. For small lipomas that are not causing problems, no treatment is needed. If a lipoma is bigger or it causes problems, surgery may be done to remove the lipoma. Lipomas can also be removed to improve appearance. Most often, the procedure is done after applying a medicine that numbs the area (local anesthetic). Liposuction may be done to reduce the size of the lipoma before it is removed through surgery, or it may be done to remove the lipoma. Lipomas are removed with this method to limit incision size and scarring. A liposuction tube is inserted through a small incision into the lipoma, and the contents of the lipoma are removed through the tube with suction. Follow these instructions at home: Watch your lipoma for any changes. Keep all follow-up visits. This is important. Where to find more information OrthoInfo: orthoinfo.aaos.org Contact a health care provider if: Your lipoma becomes larger or hard. Your lipoma becomes painful, red, or increasingly swollen.  These could be signs of infection or a more serious condition. Get help right away if: You develop tingling or numbness in an area near the lipoma. This could indicate that the lipoma is causing nerve damage. Summary A lipoma is a noncancerous tumor that is made up of fat cells. Most lipomas do not cause problems and do  not require treatment. If a lipoma is bigger or it causes problems, surgery may be done to remove the lipoma. Contact a health care provider if your lipoma becomes larger or hard, or if it becomes painful, red, or increasingly swollen. These could be signs of infection or a more serious condition. This information is not intended to replace advice given to you by your health care provider. Make sure you discuss any questions you have with your health care provider. Document Revised: 10/11/2021 Document Reviewed: 10/11/2021 Elsevier Patient Education  2024 [employer]   Emil Schaumann, MD  Primary Care at [facility]

## 2024-07-28 ENCOUNTER — Other Ambulatory Visit
